# Patient Record
Sex: Male | Born: 1940 | Race: White | Hispanic: No | Marital: Married | Smoking: Never smoker
Health system: Southern US, Community
[De-identification: ages and names within clinical notes are randomized; demographics above are authoritative.]

## PROBLEM LIST (undated history)

## (undated) DIAGNOSIS — N189 Chronic kidney disease, unspecified: Secondary | ICD-10-CM

## (undated) DIAGNOSIS — I10 Essential (primary) hypertension: Secondary | ICD-10-CM

## (undated) DIAGNOSIS — C801 Malignant (primary) neoplasm, unspecified: Secondary | ICD-10-CM

## (undated) DIAGNOSIS — G4730 Sleep apnea, unspecified: Secondary | ICD-10-CM

## (undated) HISTORY — PX: JOINT REPLACEMENT: SHX530

## (undated) HISTORY — PX: NEPHRECTOMY: SHX65

## (undated) HISTORY — PX: COLON SURGERY: SHX602

---

## 2000-01-19 ENCOUNTER — Encounter: Payer: Self-pay | Admitting: Orthopedic Surgery

## 2000-01-26 ENCOUNTER — Inpatient Hospital Stay (HOSPITAL_COMMUNITY): Admission: RE | Admit: 2000-01-26 | Discharge: 2000-02-03 | Payer: Self-pay | Admitting: Orthopedic Surgery

## 2000-01-30 ENCOUNTER — Encounter: Payer: Self-pay | Admitting: Orthopedic Surgery

## 2000-01-31 ENCOUNTER — Encounter: Payer: Self-pay | Admitting: Internal Medicine

## 2000-02-03 ENCOUNTER — Encounter: Payer: Self-pay | Admitting: Physical Medicine & Rehabilitation

## 2000-02-03 ENCOUNTER — Inpatient Hospital Stay (HOSPITAL_COMMUNITY)
Admission: RE | Admit: 2000-02-03 | Discharge: 2000-02-09 | Payer: Self-pay | Admitting: Physical Medicine & Rehabilitation

## 2000-02-10 ENCOUNTER — Encounter
Admission: RE | Admit: 2000-02-10 | Discharge: 2000-05-03 | Payer: Self-pay | Admitting: Physical Medicine & Rehabilitation

## 2000-02-16 ENCOUNTER — Encounter: Payer: Self-pay | Admitting: Family Medicine

## 2000-02-16 ENCOUNTER — Encounter: Admission: RE | Admit: 2000-02-16 | Discharge: 2000-02-16 | Payer: Self-pay | Admitting: Family Medicine

## 2001-08-02 ENCOUNTER — Encounter: Payer: Self-pay | Admitting: Family Medicine

## 2001-08-02 ENCOUNTER — Encounter: Admission: RE | Admit: 2001-08-02 | Discharge: 2001-08-02 | Payer: Self-pay | Admitting: Family Medicine

## 2003-03-09 ENCOUNTER — Emergency Department (HOSPITAL_COMMUNITY): Admission: EM | Admit: 2003-03-09 | Discharge: 2003-03-09 | Payer: Self-pay | Admitting: Emergency Medicine

## 2003-03-09 ENCOUNTER — Encounter: Payer: Self-pay | Admitting: Emergency Medicine

## 2003-03-19 ENCOUNTER — Encounter: Payer: Self-pay | Admitting: Urology

## 2003-03-19 ENCOUNTER — Encounter: Admission: RE | Admit: 2003-03-19 | Discharge: 2003-03-19 | Payer: Self-pay | Admitting: Urology

## 2003-03-23 ENCOUNTER — Encounter: Payer: Self-pay | Admitting: Urology

## 2003-03-26 ENCOUNTER — Inpatient Hospital Stay (HOSPITAL_COMMUNITY): Admission: RE | Admit: 2003-03-26 | Discharge: 2003-03-30 | Payer: Self-pay | Admitting: Urology

## 2003-03-26 ENCOUNTER — Encounter (INDEPENDENT_AMBULATORY_CARE_PROVIDER_SITE_OTHER): Payer: Self-pay

## 2003-06-01 ENCOUNTER — Encounter: Admission: RE | Admit: 2003-06-01 | Discharge: 2003-06-01 | Payer: Self-pay | Admitting: Family Medicine

## 2003-06-01 ENCOUNTER — Encounter: Payer: Self-pay | Admitting: Family Medicine

## 2003-06-07 ENCOUNTER — Encounter: Admission: RE | Admit: 2003-06-07 | Discharge: 2003-06-07 | Payer: Self-pay | Admitting: Urology

## 2003-06-07 ENCOUNTER — Encounter: Payer: Self-pay | Admitting: Urology

## 2004-05-13 ENCOUNTER — Ambulatory Visit (HOSPITAL_COMMUNITY): Admission: RE | Admit: 2004-05-13 | Discharge: 2004-05-13 | Payer: Self-pay | Admitting: Urology

## 2004-10-10 ENCOUNTER — Ambulatory Visit (HOSPITAL_COMMUNITY): Admission: RE | Admit: 2004-10-10 | Discharge: 2004-10-10 | Payer: Self-pay | Admitting: Urology

## 2004-11-02 HISTORY — PX: OTHER SURGICAL HISTORY: SHX169

## 2005-12-08 ENCOUNTER — Ambulatory Visit (HOSPITAL_COMMUNITY): Admission: RE | Admit: 2005-12-08 | Discharge: 2005-12-08 | Payer: Self-pay | Admitting: General Surgery

## 2006-01-13 ENCOUNTER — Encounter: Admission: RE | Admit: 2006-01-13 | Discharge: 2006-01-13 | Payer: Self-pay | Admitting: Urology

## 2006-01-26 ENCOUNTER — Ambulatory Visit (HOSPITAL_COMMUNITY): Admission: RE | Admit: 2006-01-26 | Discharge: 2006-01-27 | Payer: Self-pay | Admitting: Interventional Radiology

## 2006-01-26 ENCOUNTER — Encounter (INDEPENDENT_AMBULATORY_CARE_PROVIDER_SITE_OTHER): Payer: Self-pay | Admitting: *Deleted

## 2006-02-23 ENCOUNTER — Encounter: Admission: RE | Admit: 2006-02-23 | Discharge: 2006-02-23 | Payer: Self-pay | Admitting: Interventional Radiology

## 2006-06-30 ENCOUNTER — Encounter: Admission: RE | Admit: 2006-06-30 | Discharge: 2006-06-30 | Payer: Self-pay | Admitting: Interventional Radiology

## 2007-02-17 ENCOUNTER — Encounter: Admission: RE | Admit: 2007-02-17 | Discharge: 2007-02-17 | Payer: Self-pay | Admitting: Interventional Radiology

## 2007-10-25 IMAGING — CT CT GUIDANCE TISSUE ABLATION
2 of 7 series · 12 of 32 positions shown, 17 images · non-contrast
Comparison: none

CLINICAL DATA: 54-year-old male with a prior history of right nephrectomy for renal cell carcinoma in March 2003.   The patient has now been diagnosed with a new and enlarging enhancing solid tumor of the left kidney emanating from the lateral upper pole cortex.   He has been referred for ablation of the tumor.  Prior consultation was performed on 01/13/06. 
[DATE].  CT GUIDED CORE BIOPSY OF THE LEFT KIDNEY:
2.  CT GUIDED RADIOFREQUENCY ABLATION OF LEFT RENAL TUMOR:
Prior to the procedure detailed informed consent had been obtained.  The patient received 1 gram IV Ancef.  
Anesthesia:  General.
After the patient was placed under general anesthesia he was put into a prone position on a CT table.  Once secured, unenhanced imaging was performed through the level of the entire left kidney.  After localizing a site for biopsy and ablation procedures along the left flank region, a large area was sterilely prepped and draped.  
Initial planning of angle for needle placement was performed by partially advancing a 22 gauge spinal needle.  Position was confirmed with CT images.  A 17 gauge Trochar needle was then advanced into the posterior margin of a left renal mass.  A single 18 gauge core sample was then performed utilizing an automated biopsy device.  The core sample was then placed in formalin for histologic analysis.  
Tandem placement of a 17 gauge Cool-Tip radiofrequency ablation probe was then performed.  A 10 cm length x 3 cm ablation zone probe was chosen for placement.  This was advanced under CT guidance into the left renal tumor.  After confirming ablation probe position a 12 minute cycle of radiofrequency ablation was performed with the [REDACTED] device.  Tissue temperatures were then measured as the probe was retracted.  The probe was then repositioned into a slightly higher position within the renal mass and additional 12 minute cycle of ablation performed.  Tissue temperatures were measured as the probe was retracted.  Upon completion of ablation the tract was cauterized to the level of the posterolateral oblique musculature.

[Series 2: rfa 5.0 b10f · axial · 0.98mm/px · z∈[-206,-56]mm · 7 of 40 slices shown, 12 images (1 of 2)]
[im 5/40  soft-tissue]
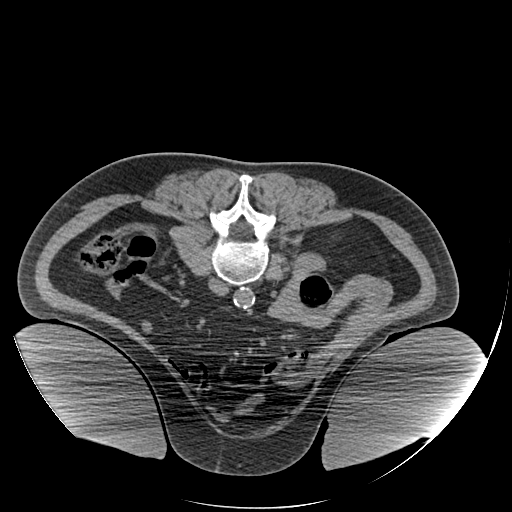
[im 5/40  bone]
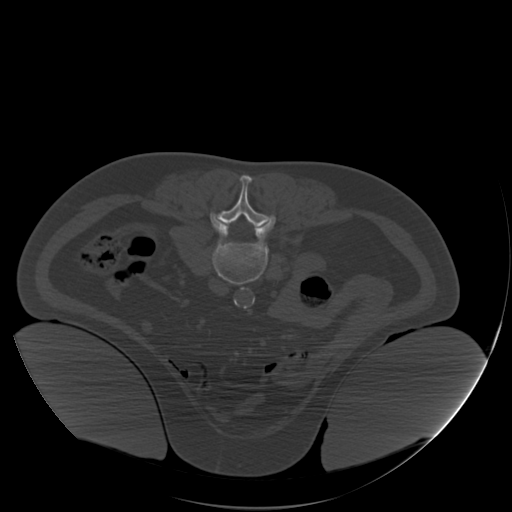
[im 10/40  soft-tissue]
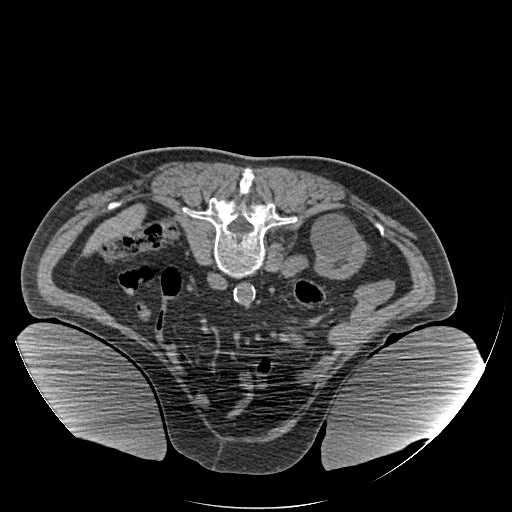
[im 15/40  soft-tissue]
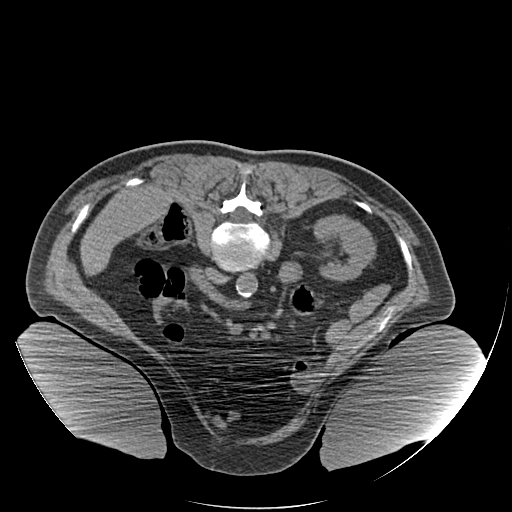
[im 20/40  soft-tissue]
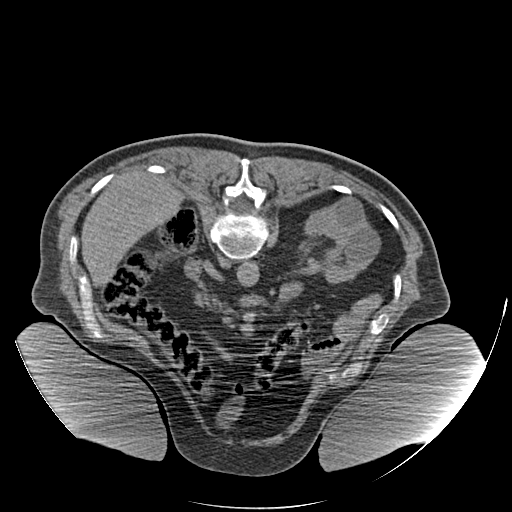
[im 20/40  lung]
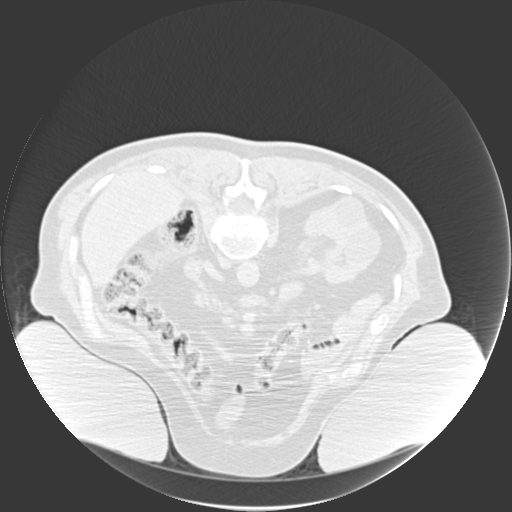
[im 25/40  soft-tissue]
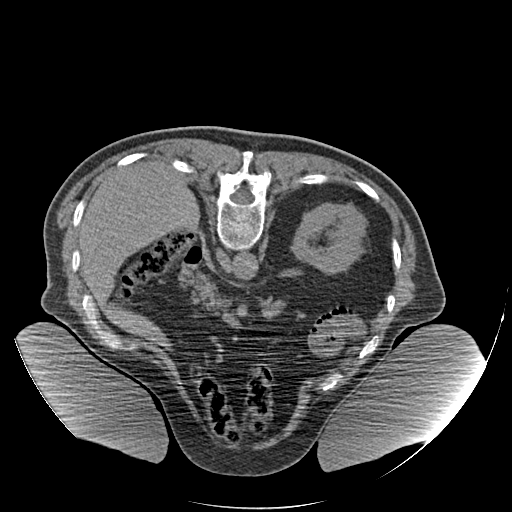
[im 25/40  lung]
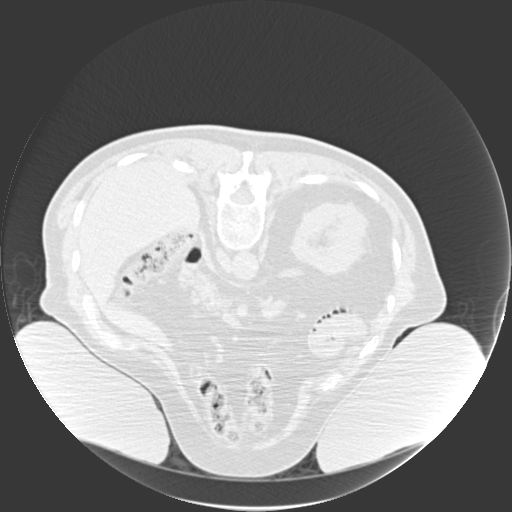
[im 30/40  soft-tissue]
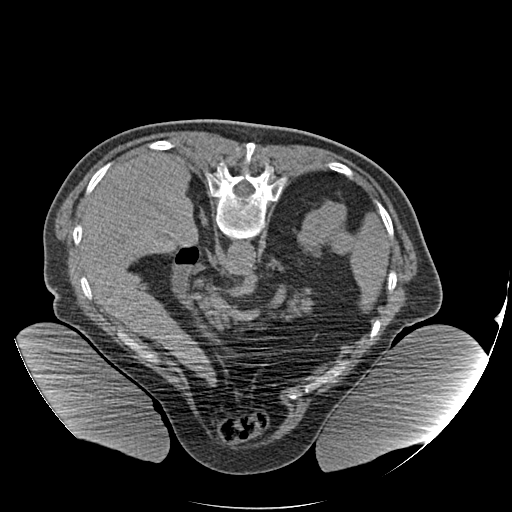
[im 30/40  lung]
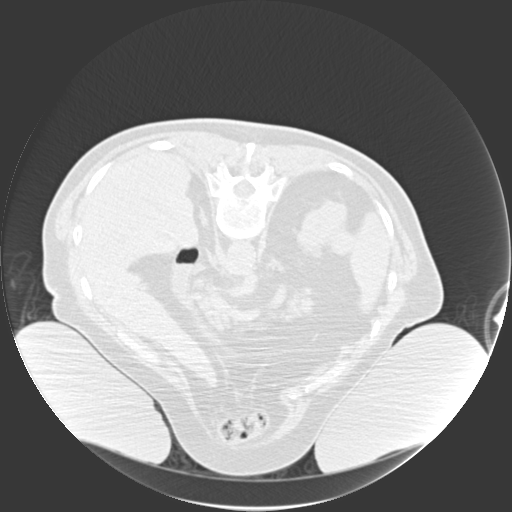
[im 35/40  soft-tissue]
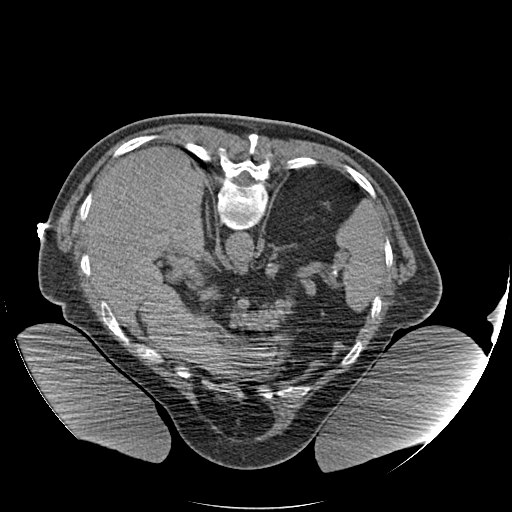
[im 35/40  lung]
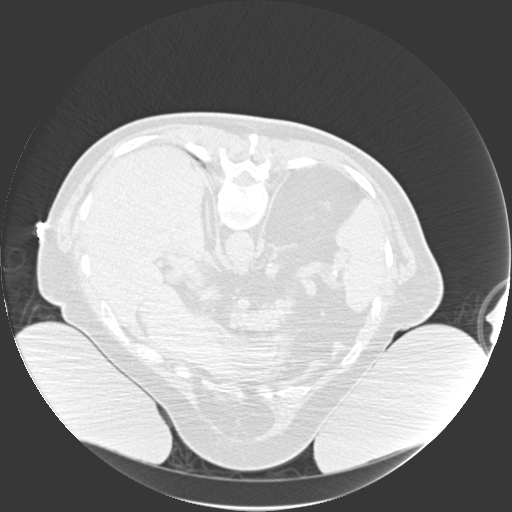

[Series 8: rfa 5.0 b10f · axial · 0.98mm/px · z∈[-166,-76]mm · 5 of 33 slices shown (2 of 2)]
[im 5/33  soft-tissue]
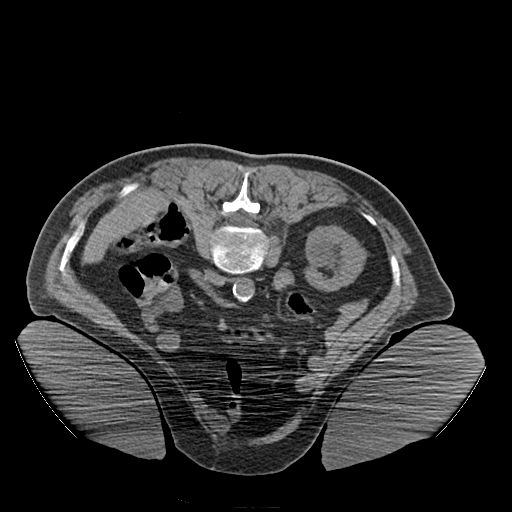
[im 10/33  soft-tissue]
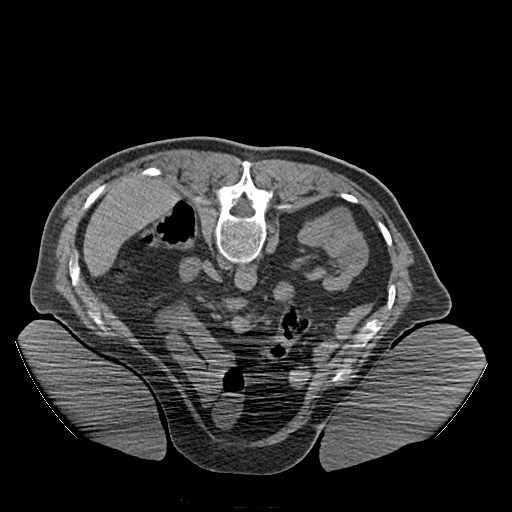
[im 14/33  soft-tissue]
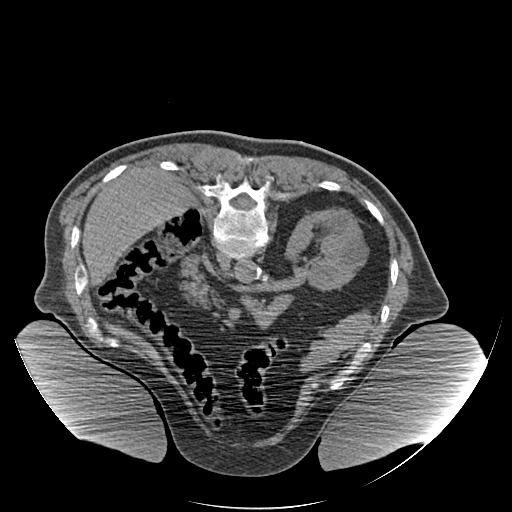
[im 19/33  soft-tissue]
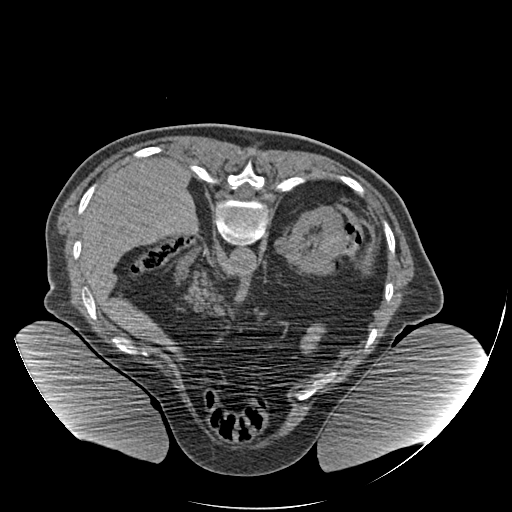
[im 23/33  soft-tissue]
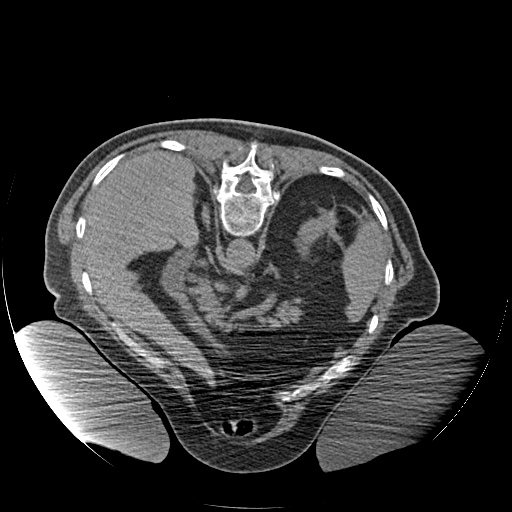

[12 of 32 positions shown; findings below may reference images not displayed]

FINDINGS: Initial CT again demonstrates the previously studied enhancing solid tumor emanating from the lateral cortex of the upper pole of the remaining left kidney.  This measures approximately 2.6 cm in greatest diameter.  Core biopsy was able to be performed directly through the central portion of the mass yielding 1 excellent tissue core sample.  Immediately after biopsy CT did show a small amount of adjacent perinephric hemorrhage.  
Subsequent tissue ablation was performed.  Initially probe placement was through the central portion of the mass with a single 12 minute cycle performed.  This resulted in adequate tissue temperatures within the tumor of greater than 80 degrees Centigrade.  
Due to somewhat slightly irregular shape of the lesion with a more lateral exophytic superior component it was decided to perform a 2nd cycle of ablation after adjusting the probe into the more superior aspect of the tumor.  After a 12 minute cycle this did result in adequate tissue temperatures of greater than 80 degrees centigrade within the tumor.  A short segment of the tract was then cauterized upon completion of the procedure.  Final CT images show a stable small amount of perinephric hemorrhage present immediately posterolateral to the upper to mid-kidney.  Upon completion of the procedure the patient was sent to PACU for initial recovery.  He also will be admitted for overnight observation.
IMPRESSION: Successful CT guided core biopsy and percutaneous radiofrequency ablation of a solid tumor of the left kidney.  A single 18 G core tissue sample was sent for histology.  Adequate tissue temperatures were achieved in the tumor after 2 cycles of radiofrequency ablation.  The patient was admitted for overnight observation for pain control and monitoring.  CBC and renal function will be checked in the morning and discharge is planned on 01/27/06.  week followup CT and outpatient visit will also be scheduled at the time of discharge.

## 2008-01-25 ENCOUNTER — Encounter: Admission: RE | Admit: 2008-01-25 | Discharge: 2008-01-25 | Payer: Self-pay | Admitting: Interventional Radiology

## 2009-02-26 ENCOUNTER — Encounter: Admission: RE | Admit: 2009-02-26 | Discharge: 2009-02-26 | Payer: Self-pay | Admitting: Urology

## 2009-03-01 ENCOUNTER — Encounter: Admission: RE | Admit: 2009-03-01 | Discharge: 2009-03-01 | Payer: Self-pay | Admitting: Family Medicine

## 2009-04-03 ENCOUNTER — Emergency Department: Payer: Self-pay | Admitting: Emergency Medicine

## 2009-08-07 ENCOUNTER — Ambulatory Visit: Payer: Self-pay | Admitting: Internal Medicine

## 2010-03-04 ENCOUNTER — Encounter: Admission: RE | Admit: 2010-03-04 | Discharge: 2010-03-04 | Payer: Self-pay | Admitting: Interventional Radiology

## 2010-03-04 ENCOUNTER — Ambulatory Visit (HOSPITAL_COMMUNITY): Admission: RE | Admit: 2010-03-04 | Discharge: 2010-03-04 | Payer: Self-pay | Admitting: Interventional Radiology

## 2010-11-23 ENCOUNTER — Encounter: Payer: Self-pay | Admitting: Interventional Radiology

## 2011-02-12 ENCOUNTER — Other Ambulatory Visit: Payer: Self-pay | Admitting: Interventional Radiology

## 2011-02-18 ENCOUNTER — Other Ambulatory Visit (HOSPITAL_COMMUNITY): Payer: Self-pay | Admitting: Interventional Radiology

## 2011-02-18 DIAGNOSIS — N2889 Other specified disorders of kidney and ureter: Secondary | ICD-10-CM

## 2011-03-20 NOTE — Op Note (Signed)
NAME:  Henry Lewis, Henry Lewis NO.:  [id number]   MEDICAL RECORD NO.:  [id number]                   PATIENT TYPE:  INP   LOCATION:  0009                                 FACILITY:  [facility]   PHYSICIAN:  Rozanna Boer., M.D.      DATE OF BIRTH:  07/23/1941   DATE OF PROCEDURE:  03/26/2003  DATE OF DISCHARGE:                                 OPERATIVE REPORT   PREOPERATIVE DIAGNOSIS:  Right renal cell carcinoma.   POSTOPERATIVE DIAGNOSIS:  Right renal cell carcinoma pending pathology  report.   OPERATION:  Right radical nephrectomy.   ANESTHESIA:  General.   SURGEON:  Courtney Paris, M.D.   ASSISTANT:  Lindaann Slough, M.D.   PROCEDURE:  The patient was placed on the operating table in the supine  position with the kidney rest elevated. After the satisfactory induction of  general anesthesia he was shaved, prepped and draped in the usual sterile  fashion and given IV Ancef. A Foley catheter was inserted.   A right subcostal incision was made and carried down through the  subcutaneous tissue to open the external oblique fascia. Next the internal  oblique and transversalis fascias were opened as they were encountered. The  abdominal cavity was then entered.   The colon was then reflected laterally by incising the line of Toldt up and  around the hepatic flexure of the colon. I freed up the duodenum off the  vena cava and then dissected down onto the vena cava. I was  able to find  the renal vein fairly anteriorly. A #1 silk suture was passed around this  but not tied. The artery was just below this. Further dissection revealed  the artery and again a proximal tie was then made on the artery and then the  vein was then divided between #1 silk sutures with 2 proximal ties, 1  distal. The renal vein was then cut.   Next  the artery was then identified  and 2 more sutures were placed, one  more proximal and one distal and this was also cut.  This seemed to be the  main of the blood supply except for the gonadal vessel which was tied  off  close to the cava with 0 silk suture and dissection was carried anteriorly  across the anterior fascia around the kidney. The adrenal vein was also  identified, ligated with silk and clips.   The uppermost part of the kidney was then freed up, and then pulling the  kidney down, I went through the hilum with no other branches of the vessels  being found. I found the ureter, clipped it, and then ligated another vein  next to it. The specimen was then removed intact with its Gerota's fascia  surroundings.   The wound was then irrigated with sterile water. There was no evidence of  any enlarged lymphadenopathy in the wound and hemostasis was quite good. The  colon was then  placed back in its anatomic location and the wound was closed  with running #1 PDS sutures for the transversalis layer and internal and  external oblique layers. The skin was  closed with clips. No drain was placed. All sponge, instrument and needle  counts were correct on 2 occasions. Estimated blood loss was less than 300  cc which the patient tolerated without difficulty. The patient was taken to  the recovery room in good condition to be later admitted to the for further  treatment and care.                                               Rozanna Boer., M.D.    HMK/MEDQ  D:  03/26/2003  T:  03/26/2003  Job:  [id number]
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
## 2011-03-25 ENCOUNTER — Ambulatory Visit
Admission: RE | Admit: 2011-03-25 | Discharge: 2011-03-25 | Disposition: A | Discharge status: Home / Self Care | Payer: Medicare Other | Source: Ambulatory Visit | Attending: Interventional Radiology | Admitting: Interventional Radiology

## 2011-03-25 ENCOUNTER — Encounter (HOSPITAL_COMMUNITY): Payer: Self-pay

## 2011-03-25 ENCOUNTER — Ambulatory Visit (HOSPITAL_COMMUNITY)
Admission: RE | Admit: 2011-03-25 | Discharge: 2011-03-25 | Disposition: A | Discharge status: Home / Self Care | Payer: Medicare Other | Source: Ambulatory Visit | Attending: Interventional Radiology | Admitting: Interventional Radiology

## 2011-03-25 DIAGNOSIS — C649 Malignant neoplasm of unspecified kidney, except renal pelvis: Secondary | ICD-10-CM | POA: Insufficient documentation

## 2011-03-25 DIAGNOSIS — N2889 Other specified disorders of kidney and ureter: Secondary | ICD-10-CM

## 2011-03-25 DIAGNOSIS — Z905 Acquired absence of kidney: Secondary | ICD-10-CM | POA: Insufficient documentation

## 2011-03-25 DIAGNOSIS — K439 Ventral hernia without obstruction or gangrene: Secondary | ICD-10-CM | POA: Insufficient documentation

## 2011-03-25 DIAGNOSIS — I700 Atherosclerosis of aorta: Secondary | ICD-10-CM | POA: Insufficient documentation

## 2011-03-25 DIAGNOSIS — Q618 Other cystic kidney diseases: Secondary | ICD-10-CM | POA: Insufficient documentation

## 2011-03-25 HISTORY — DX: Malignant (primary) neoplasm, unspecified: C80.1

## 2011-03-25 HISTORY — DX: Essential (primary) hypertension: I10

## 2011-03-25 MED ORDER — IOHEXOL 300 MG/ML  SOLN
75.0000 mL | Freq: Once | INTRAMUSCULAR | Status: AC | PRN
  Administered 2011-03-25: 75 mL via INTRAVENOUS

## 2012-10-19 ENCOUNTER — Emergency Department: Payer: Self-pay | Admitting: Emergency Medicine

## 2013-04-07 ENCOUNTER — Ambulatory Visit: Payer: Self-pay | Admitting: Internal Medicine

## 2015-09-11 ENCOUNTER — Encounter: Payer: Self-pay | Admitting: *Deleted

## 2015-09-12 ENCOUNTER — Encounter: Payer: Self-pay | Admitting: *Deleted

## 2015-09-12 ENCOUNTER — Ambulatory Visit
Admission: RE | Admit: 2015-09-12 | Discharge: 2015-09-12 | Disposition: A | Discharge status: Home / Self Care | Payer: Medicare Other | Source: Ambulatory Visit | Attending: Unknown Physician Specialty | Admitting: Unknown Physician Specialty

## 2015-09-12 ENCOUNTER — Ambulatory Visit: Payer: Medicare Other | Admitting: Anesthesiology

## 2015-09-12 ENCOUNTER — Encounter
Admission: RE | Disposition: A | Discharge status: Home / Self Care | Payer: Self-pay | Source: Ambulatory Visit | Attending: Unknown Physician Specialty

## 2015-09-12 DIAGNOSIS — Z1211 Encounter for screening for malignant neoplasm of colon: Secondary | ICD-10-CM | POA: Diagnosis not present

## 2015-09-12 DIAGNOSIS — Z905 Acquired absence of kidney: Secondary | ICD-10-CM | POA: Insufficient documentation

## 2015-09-12 DIAGNOSIS — Z7982 Long term (current) use of aspirin: Secondary | ICD-10-CM | POA: Diagnosis not present

## 2015-09-12 DIAGNOSIS — Z85528 Personal history of other malignant neoplasm of kidney: Secondary | ICD-10-CM | POA: Insufficient documentation

## 2015-09-12 DIAGNOSIS — I129 Hypertensive chronic kidney disease with stage 1 through stage 4 chronic kidney disease, or unspecified chronic kidney disease: Secondary | ICD-10-CM | POA: Insufficient documentation

## 2015-09-12 DIAGNOSIS — K640 First degree hemorrhoids: Secondary | ICD-10-CM | POA: Insufficient documentation

## 2015-09-12 DIAGNOSIS — N189 Chronic kidney disease, unspecified: Secondary | ICD-10-CM | POA: Diagnosis not present

## 2015-09-12 DIAGNOSIS — K5730 Diverticulosis of large intestine without perforation or abscess without bleeding: Secondary | ICD-10-CM | POA: Diagnosis not present

## 2015-09-12 DIAGNOSIS — G4730 Sleep apnea, unspecified: Secondary | ICD-10-CM | POA: Diagnosis not present

## 2015-09-12 DIAGNOSIS — Z79899 Other long term (current) drug therapy: Secondary | ICD-10-CM | POA: Insufficient documentation

## 2015-09-12 DIAGNOSIS — Z96653 Presence of artificial knee joint, bilateral: Secondary | ICD-10-CM | POA: Insufficient documentation

## 2015-09-12 HISTORY — DX: Chronic kidney disease, unspecified: N18.9

## 2015-09-12 HISTORY — PX: COLONOSCOPY WITH PROPOFOL: SHX5780

## 2015-09-12 HISTORY — DX: Sleep apnea, unspecified: G47.30

## 2015-09-12 SURGERY — COLONOSCOPY WITH PROPOFOL
Anesthesia: General

## 2015-09-12 MED ORDER — SODIUM CHLORIDE 0.9 % IV SOLN
INTRAVENOUS | Status: DC
  Administered 2015-09-12: 10:00:00 via INTRAVENOUS

## 2015-09-12 MED ORDER — PROPOFOL 500 MG/50ML IV EMUL
INTRAVENOUS | Status: DC | PRN
  Administered 2015-09-12: 120 ug/kg/min via INTRAVENOUS

## 2015-09-12 MED ORDER — PIPERACILLIN-TAZOBACTAM 3.375 G IVPB
3.3750 g | Freq: Once | INTRAVENOUS | Status: AC
  Administered 2015-09-12: 3.375 g via INTRAVENOUS
  Filled 2015-09-12: qty 50

## 2015-09-12 MED ORDER — MIDAZOLAM HCL 2 MG/2ML IJ SOLN
INTRAMUSCULAR | Status: DC | PRN
  Administered 2015-09-12: 1 mg via INTRAVENOUS

## 2015-09-12 MED ORDER — SODIUM CHLORIDE 0.9 % IV SOLN
INTRAVENOUS | Status: DC
Start: 2015-09-12 — End: 2015-09-12

## 2015-09-12 MED ORDER — FENTANYL CITRATE (PF) 100 MCG/2ML IJ SOLN
INTRAMUSCULAR | Status: DC | PRN
  Administered 2015-09-12: 50 ug via INTRAVENOUS
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
## 2015-09-12 NOTE — Op Note (Signed)
[facility] Patient Name: Henry Lewis Procedure Date: 09/12/2015 10:15 AM MRN: [id number] Account #: [id number] Date of Birth: June 27, 1941 Admit Type: Outpatient Age: 74 Room: [facility] ENDO ROOM 1 Gender: Male Note Status: Finalized Procedure:         Colonoscopy Indications:       Screening for colorectal malignant neoplasm Providers:         Manya Silvas, MD Referring MD:      Hewitt Blade. Sarina Ser, MD (Referring MD) Medicines:         Propofol per Anesthesia Complications:     No immediate complications. Procedure:         Pre-Anesthesia Assessment:                    - After reviewing the risks and benefits, the patient was                     deemed in satisfactory condition to undergo the procedure.                    After obtaining informed consent, the colonoscope was                     passed under direct vision. Throughout the procedure, the                     patient's blood pressure, pulse, and oxygen saturations                     were monitored continuously. The Colonoscope was                     introduced through the anus and advanced to the the cecum,                     identified by appendiceal orifice and ileocecal valve. The                     colonoscopy was performed without difficulty. The patient                     tolerated the procedure well. The quality of the bowel                     preparation was excellent. Findings:      A single small-mouthed diverticulum was found in the sigmoid colon.      Internal hemorrhoids were found during endoscopy. The hemorrhoids were       small and Grade I (internal hemorrhoids that do not prolapse).      The exam was otherwise without abnormality. Impression:        - Diverticulosis in the sigmoid colon.                    - Internal hemorrhoids.                    - No specimens collected. Recommendation:    - The findings and recommendations were discussed with the                 patient. Manya Silvas, MD 09/12/2015 10:43:16 AM This report has been signed electronically. Number of Addenda: 0 Note Initiated On: 09/12/2015 10:15 AM Scope Withdrawal Time: 0 hours 7 minutes 36 seconds  Total Procedure  Duration: 0 hours 11 minutes 18 seconds       [facility]

## 2015-09-12 NOTE — Anesthesia Preprocedure Evaluation (Addendum)
Anesthesia Evaluation  Patient identified by MRN, date of birth, ID band Patient awake    Reviewed: Allergy & Precautions, NPO status , Patient's Chart, lab work & pertinent test results  Airway Mallampati: II  TM Distance: >3 FB     Dental  (+) Upper Dentures, Lower Dentures   Pulmonary sleep apnea ,    Pulmonary exam normal        Cardiovascular hypertension, Pt. on medications Normal cardiovascular exam     Neuro/Psych negative neurological ROS  negative psych ROS   GI/Hepatic negative GI ROS, Neg liver ROS,   Endo/Other  negative endocrine ROS  Renal/GU Renal diseaseRenal cell CA  negative genitourinary   Musculoskeletal negative musculoskeletal ROS (+)   Abdominal Normal abdominal exam  (+)   Peds negative pediatric ROS (+)  Hematology negative hematology ROS (+)   Anesthesia Other Findings   Reproductive/Obstetrics                            Anesthesia Physical Anesthesia Plan  ASA: III  Anesthesia Plan: General   Post-op Pain Management:    Induction: Intravenous  Airway Management Planned: Nasal Cannula  Additional Equipment:   Intra-op Plan:   Post-operative Plan:   Informed Consent: I have reviewed the patients History and Physical, chart, labs and discussed the procedure including the risks, benefits and alternatives for the proposed anesthesia with the patient or authorized representative who has indicated his/her understanding and acceptance.   Dental advisory given  Plan Discussed with: CRNA and Surgeon  Anesthesia Plan Comments:         Anesthesia Quick Evaluation

## 2015-09-12 NOTE — Transfer of Care (Signed)
Immediate Anesthesia Transfer of Care Note  Patient: Henry Lewis  Procedure(s) Performed: Procedure(s): COLONOSCOPY WITH PROPOFOL (N/A)  Patient Location: PACU  Anesthesia Type:General  Level of Consciousness: awake and unresponsive  Airway & Oxygen Therapy: Patient Spontanous Breathing and Patient connected to nasal cannula oxygen  Post-op Assessment: Report given to RN and Post -op Vital signs reviewed and stable  Post vital signs: Reviewed and stable  Last Vitals:  Filed Vitals:   09/12/15 1045  BP:   Pulse: 62  Temp: 36.4 C  Resp: 20    Complications: none

## 2015-09-12 NOTE — Anesthesia Procedure Notes (Signed)
Performed by: COOK-MARTIN, Shirley Bolle Pre-anesthesia Checklist: Patient identified, Emergency Drugs available, Suction available, Patient being monitored and Timeout performed Patient Re-evaluated:Patient Re-evaluated prior to inductionOxygen Delivery Method: Nasal cannula Preoxygenation: Pre-oxygenation with 100% oxygen Intubation Type: IV induction Placement Confirmation: positive ETCO2 and CO2 detector       

## 2015-09-12 NOTE — Anesthesia Postprocedure Evaluation (Signed)
  Anesthesia Post-op Note  Patient: Henry Lewis  Procedure(s) Performed: Procedure(s): COLONOSCOPY WITH PROPOFOL (N/A)  Anesthesia type:General  Patient location: PACU  Post pain: Pain level controlled  Post assessment: Post-op Vital signs reviewed, Patient's Cardiovascular Status Stable, Respiratory Function Stable, Patent Airway and No signs of Nausea or vomiting  Post vital signs: Reviewed and stable  Last Vitals:  Filed Vitals:   09/12/15 1110  BP: 123/77  Pulse: 59  Temp:   Resp: 18    Level of consciousness: awake, alert  and patient cooperative  Complications: No apparent anesthesia complications

## 2015-09-16 ENCOUNTER — Encounter: Payer: Self-pay | Admitting: Unknown Physician Specialty

## 2016-05-29 ENCOUNTER — Inpatient Hospital Stay
Admission: EM | Admit: 2016-05-29 | Discharge: 2016-06-01 | DRG: 186 | Disposition: A | Discharge status: Home / Self Care | Payer: Medicare Other | Attending: Specialist | Admitting: Specialist

## 2016-05-29 ENCOUNTER — Encounter: Payer: Self-pay | Admitting: Emergency Medicine

## 2016-05-29 ENCOUNTER — Emergency Department: Payer: Medicare Other

## 2016-05-29 DIAGNOSIS — N183 Chronic kidney disease, stage 3 (moderate): Secondary | ICD-10-CM | POA: Diagnosis present

## 2016-05-29 DIAGNOSIS — I129 Hypertensive chronic kidney disease with stage 1 through stage 4 chronic kidney disease, or unspecified chronic kidney disease: Secondary | ICD-10-CM | POA: Diagnosis present

## 2016-05-29 DIAGNOSIS — M109 Gout, unspecified: Secondary | ICD-10-CM | POA: Diagnosis present

## 2016-05-29 DIAGNOSIS — Z96653 Presence of artificial knee joint, bilateral: Secondary | ICD-10-CM | POA: Diagnosis present

## 2016-05-29 DIAGNOSIS — J9600 Acute respiratory failure, unspecified whether with hypoxia or hypercapnia: Secondary | ICD-10-CM

## 2016-05-29 DIAGNOSIS — Z7982 Long term (current) use of aspirin: Secondary | ICD-10-CM

## 2016-05-29 DIAGNOSIS — J9601 Acute respiratory failure with hypoxia: Secondary | ICD-10-CM | POA: Diagnosis present

## 2016-05-29 DIAGNOSIS — Z96661 Presence of right artificial ankle joint: Secondary | ICD-10-CM | POA: Diagnosis present

## 2016-05-29 DIAGNOSIS — Z905 Acquired absence of kidney: Secondary | ICD-10-CM | POA: Diagnosis not present

## 2016-05-29 DIAGNOSIS — Z85528 Personal history of other malignant neoplasm of kidney: Secondary | ICD-10-CM

## 2016-05-29 DIAGNOSIS — J90 Pleural effusion, not elsewhere classified: Principal | ICD-10-CM | POA: Diagnosis present

## 2016-05-29 DIAGNOSIS — Z9889 Other specified postprocedural states: Secondary | ICD-10-CM

## 2016-05-29 DIAGNOSIS — G4733 Obstructive sleep apnea (adult) (pediatric): Secondary | ICD-10-CM | POA: Diagnosis present

## 2016-05-29 DIAGNOSIS — Z79899 Other long term (current) drug therapy: Secondary | ICD-10-CM | POA: Diagnosis not present

## 2016-05-29 DIAGNOSIS — R0902 Hypoxemia: Secondary | ICD-10-CM

## 2016-05-29 LAB — COMPREHENSIVE METABOLIC PANEL
ALK PHOS: 145 U/L — AB (ref 38–126)
ALT: 55 U/L (ref 17–63)
AST: 48 U/L — AB (ref 15–41)
Albumin: 2.8 g/dL — ABNORMAL LOW (ref 3.5–5.0)
Anion gap: 9 (ref 5–15)
BILIRUBIN TOTAL: 0.6 mg/dL (ref 0.3–1.2)
BUN: 19 mg/dL (ref 6–20)
CALCIUM: 9 mg/dL (ref 8.9–10.3)
CHLORIDE: 106 mmol/L (ref 101–111)
CO2: 26 mmol/L (ref 22–32)
CREATININE: 0.99 mg/dL (ref 0.61–1.24)
Glucose, Bld: 131 mg/dL — ABNORMAL HIGH (ref 65–99)
Potassium: 3.8 mmol/L (ref 3.5–5.1)
Sodium: 141 mmol/L (ref 135–145)
TOTAL PROTEIN: 7 g/dL (ref 6.5–8.1)

## 2016-05-29 LAB — CBC
HEMATOCRIT: 38.6 % — AB (ref 40.0–52.0)
Hemoglobin: 13.1 g/dL (ref 13.0–18.0)
MCH: 30.5 pg (ref 26.0–34.0)
MCHC: 33.9 g/dL (ref 32.0–36.0)
MCV: 90 fL (ref 80.0–100.0)
PLATELETS: 352 10*3/uL (ref 150–440)
RBC: 4.29 MIL/uL — AB (ref 4.40–5.90)
RDW: 14 % (ref 11.5–14.5)
WBC: 14.4 10*3/uL — AB (ref 3.8–10.6)

## 2016-05-29 LAB — LACTIC ACID, PLASMA
LACTIC ACID, VENOUS: 1.5 mmol/L (ref 0.5–1.9)
Lactic Acid, Venous: 1.3 mmol/L (ref 0.5–1.9)

## 2016-05-29 LAB — TROPONIN I

## 2016-05-29 MED ORDER — ACETAMINOPHEN 325 MG PO TABS
650.0000 mg | ORAL_TABLET | Freq: Four times a day (QID) | ORAL | Status: DC | PRN
  Administered 2016-05-29 – 2016-05-30 (×3): 650 mg via ORAL
  Filled 2016-05-29 (×3): qty 2

## 2016-05-29 MED ORDER — HYDROCHLOROTHIAZIDE 25 MG PO TABS
25.0000 mg | ORAL_TABLET | Freq: Every day | ORAL | Status: DC
  Administered 2016-05-29 – 2016-06-01 (×4): 25 mg via ORAL
  Filled 2016-05-29 (×4): qty 1

## 2016-05-29 MED ORDER — ASPIRIN EC 81 MG PO TBEC
81.0000 mg | DELAYED_RELEASE_TABLET | Freq: Every day | ORAL | Status: DC
  Administered 2016-05-30 – 2016-06-01 (×3): 81 mg via ORAL
  Filled 2016-05-29 (×3): qty 1

## 2016-05-29 MED ORDER — ALLOPURINOL 100 MG PO TABS
300.0000 mg | ORAL_TABLET | Freq: Every day | ORAL | Status: DC
  Administered 2016-05-29 – 2016-06-01 (×4): 300 mg via ORAL
  Filled 2016-05-29 (×5): qty 3

## 2016-05-29 MED ORDER — SODIUM CHLORIDE 0.9% FLUSH
3.0000 mL | Freq: Two times a day (BID) | INTRAVENOUS | Status: DC
  Administered 2016-05-29 – 2016-06-01 (×6): 3 mL via INTRAVENOUS

## 2016-05-29 MED ORDER — DEXTROSE 5 % IV SOLN
1.0000 g | INTRAVENOUS | Status: DC
  Administered 2016-05-29 – 2016-05-31 (×3): 1 g via INTRAVENOUS
  Filled 2016-05-29 (×4): qty 10

## 2016-05-29 MED ORDER — ONDANSETRON HCL 4 MG/2ML IJ SOLN: 4.0000 mg | Freq: Four times a day (QID) | INTRAMUSCULAR | Status: DC | PRN

## 2016-05-29 MED ORDER — ACETAMINOPHEN 650 MG RE SUPP: 650.0000 mg | Freq: Four times a day (QID) | RECTAL | Status: DC | PRN

## 2016-05-29 MED ORDER — LISINOPRIL-HYDROCHLOROTHIAZIDE 20-25 MG PO TABS: 1.0000 | ORAL_TABLET | Freq: Every day | ORAL | Status: DC

## 2016-05-29 MED ORDER — ONDANSETRON HCL 4 MG PO TABS: 4.0000 mg | ORAL_TABLET | Freq: Four times a day (QID) | ORAL | Status: DC | PRN

## 2016-05-29 MED ORDER — ACETAMINOPHEN 500 MG PO TABS
1000.0000 mg | ORAL_TABLET | ORAL | Status: AC
  Administered 2016-05-29: 1000 mg via ORAL
  Filled 2016-05-29: qty 2

## 2016-05-29 MED ORDER — VANCOMYCIN HCL IN DEXTROSE 1-5 GM/200ML-% IV SOLN
1000.0000 mg | Freq: Once | INTRAVENOUS | Status: AC
  Administered 2016-05-29: 1000 mg via INTRAVENOUS
  Filled 2016-05-29: qty 200

## 2016-05-29 MED ORDER — LISINOPRIL 20 MG PO TABS
20.0000 mg | ORAL_TABLET | Freq: Every day | ORAL | Status: DC
  Administered 2016-05-29 – 2016-06-01 (×4): 20 mg via ORAL
  Filled 2016-05-29 (×4): qty 1

## 2016-05-29 MED ORDER — AZITHROMYCIN 500 MG IV SOLR
250.0000 mg | INTRAVENOUS | Status: DC
  Administered 2016-05-29 – 2016-05-31 (×3): 250 mg via INTRAVENOUS
  Filled 2016-05-29 (×4): qty 250

## 2016-05-29 NOTE — ED Notes (Addendum)
MD at bedside and patient informed of needing admission and results of all imagine workups.
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
## 2016-05-29 NOTE — ED Triage Notes (Signed)
Pt was diagnosed with pneumonia a couple weeks ago, pt completed first antibiotic without relief, pt is c/o shortness of breath, sats dropping into the upper 80's weakness and unable to speak as usual due shortness of breath, pt is currently taking his 2nd antibiotic

## 2016-05-30 ENCOUNTER — Inpatient Hospital Stay: Payer: Medicare Other

## 2016-05-30 ENCOUNTER — Inpatient Hospital Stay
Admit: 2016-05-30 | Discharge: 2016-05-30 | Disposition: A | Discharge status: Home / Self Care | Payer: Medicare Other | Attending: Critical Care Medicine | Admitting: Critical Care Medicine

## 2016-05-30 DIAGNOSIS — J9601 Acute respiratory failure with hypoxia: Secondary | ICD-10-CM

## 2016-05-30 LAB — GRAM STAIN

## 2016-05-30 LAB — BASIC METABOLIC PANEL
ANION GAP: 6 (ref 5–15)
BUN: 19 mg/dL (ref 6–20)
CHLORIDE: 105 mmol/L (ref 101–111)
CO2: 28 mmol/L (ref 22–32)
Calcium: 8.4 mg/dL — ABNORMAL LOW (ref 8.9–10.3)
Creatinine, Ser: 0.95 mg/dL (ref 0.61–1.24)
GFR calc non Af Amer: >60 mL/min (ref >60)
Glucose, Bld: 121 mg/dL — ABNORMAL HIGH (ref 65–99)
Potassium: 4.1 mmol/L (ref 3.5–5.1)
Sodium: 139 mmol/L (ref 135–145)

## 2016-05-30 LAB — BODY FLUID CELL COUNT WITH DIFFERENTIAL
Eos, Fluid: 5 %
Lymphs, Fluid: 84 %
Monocyte-Macrophage-Serous Fluid: 6 %
Neutrophil Count, Fluid: 5 %
OTHER CELLS FL: 0 %
Total Nucleated Cell Count, Fluid: 10433 cu mm

## 2016-05-30 LAB — AMYLASE, BODY FLUID: Amylase, Fluid: 30 U/L

## 2016-05-30 LAB — LACTATE DEHYDROGENASE, PLEURAL OR PERITONEAL FLUID: LD FL: 390 U/L — AB (ref 3–23)

## 2016-05-30 LAB — CBC
HEMATOCRIT: 37.7 % — AB (ref 40.0–52.0)
HEMOGLOBIN: 12.7 g/dL — AB (ref 13.0–18.0)
MCH: 30.4 pg (ref 26.0–34.0)
MCHC: 33.6 g/dL (ref 32.0–36.0)
MCV: 90.5 fL (ref 80.0–100.0)
Platelets: 328 10*3/uL (ref 150–440)
RBC: 4.16 MIL/uL — AB (ref 4.40–5.90)
RDW: 14 % (ref 11.5–14.5)
WBC: 14.4 10*3/uL — ABNORMAL HIGH (ref 3.8–10.6)

## 2016-05-30 LAB — PROTEIN, BODY FLUID: TOTAL PROTEIN, FLUID: 3.9 g/dL

## 2016-05-30 LAB — ECHOCARDIOGRAM COMPLETE
Height: 72 in
Weight: 3920 oz

## 2016-05-30 LAB — GLUCOSE, SEROUS FLUID: GLUCOSE FL: 116 mg/dL

## 2016-05-30 MED ORDER — IPRATROPIUM-ALBUTEROL 0.5-2.5 (3) MG/3ML IN SOLN: 3.0000 mL | Freq: Four times a day (QID) | RESPIRATORY_TRACT | Status: DC

## 2016-05-30 MED ORDER — IPRATROPIUM-ALBUTEROL 0.5-2.5 (3) MG/3ML IN SOLN
3.0000 mL | Freq: Four times a day (QID) | RESPIRATORY_TRACT | Status: DC | PRN
  Administered 2016-05-30: 3 mL via RESPIRATORY_TRACT
  Filled 2016-05-30: qty 3

## 2016-05-30 NOTE — Progress Notes (Addendum)
[place] [facility] [place] NAME: Henry Lewis    MR#:  [id number]  DATE OF BIRTH:  May 20, 1941  SUBJECTIVE:   Still having some significant shortness of breath and right-sided pleuritic chest pain. Awaiting ultrasound-guided thoracentesis today. Wife at bedside.  REVIEW OF SYSTEMS:    Review of Systems  Constitutional: Negative for chills and fever.  HENT: Negative for congestion and tinnitus.   Eyes: Negative for blurred vision and double vision.  Respiratory: Positive for shortness of breath. Negative for cough and wheezing.   Cardiovascular: Positive for chest pain (Pleuritic). Negative for orthopnea and PND.  Gastrointestinal: Negative for abdominal pain, diarrhea, nausea and vomiting.  Genitourinary: Negative for dysuria and hematuria.  Neurological: Negative for dizziness, sensory change and focal weakness.  All other systems reviewed and are negative.   Nutrition: Heart Health/Carb control Tolerating Diet: Yes Tolerating PT: Await eval.    DRUG ALLERGIES:   Allergies  Allergen Reactions  . Codeine Sulfate   . Morphine And Related Other (See Comments)    Higher doses - hallucinates  . Latex Palpitations    Skin irritation with latex bandaids    VITALS:  Blood pressure 132/75, pulse 84, temperature 98 F (36.7 C), temperature source Axillary, resp. rate (!) 92, height 6' (1.829 m), weight 111.1 kg (245 lb), SpO2 91 %.  PHYSICAL EXAMINATION:   Physical Exam  GENERAL:  75 y.o.-year-old patient lying in the bed in mild resp. Distress. EYES: Pupils equal, round, reactive to light and accommodation. No scleral icterus. Extraocular muscles intact.  HEENT: Head atraumatic, normocephalic. Oropharynx and nasopharynx clear.  NECK:  Supple, no jugular venous distention. No thyroid enlargement, no tenderness.  LUNGS: Poor air entry on the right mid to lower lung fields. Dullness to percussion. Positive use of accessory muscles. No  wheezing, rhonchi.  CARDIOVASCULAR: S1, S2 normal. No murmurs, rubs, or gallops.  ABDOMEN: Soft, nontender, nondistended. Bowel sounds present. No organomegaly or mass.  EXTREMITIES: No cyanosis, clubbing or edema b/l.    NEUROLOGIC: Cranial nerves II through XII are intact. No focal Motor or sensory deficits b/l.   PSYCHIATRIC: The patient is alert and oriented x 3.  SKIN: No obvious rash, lesion, or ulcer.    LABORATORY PANEL:   CBC  Recent Labs Lab 05/30/16 0421  WBC 14.4*  HGB 12.7*  HCT 37.7*  PLT 328   ------------------------------------------------------------------------------------------------------------------  Chemistries   Recent Labs Lab 05/29/16 1205 05/30/16 0421  NA 141 139  K 3.8 4.1  CL 106 105  CO2 26 28  GLUCOSE 131* 121*  BUN 19 19  CREATININE 0.99 0.95  CALCIUM 9.0 8.4*  AST 48*  --   ALT 55  --   ALKPHOS 145*  --   BILITOT 0.6  --    ------------------------------------------------------------------------------------------------------------------  Cardiac Enzymes  Recent Labs Lab 05/29/16 1205  TROPONINI <0.03   ------------------------------------------------------------------------------------------------------------------  RADIOLOGY:  Dg Chest 2 View  Result Date: 05/29/2016 CLINICAL DATA:  Pneumonia and shortness of breath. EXAM: CHEST  2 VIEW COMPARISON:  04/03/2009 FINDINGS: Cardiomediastinal silhouette is normal. Mediastinal contours appear intact. There is no evidence of pneumothorax. There is minimal left lung base atelectasis. There is a large right pleural effusion with right lower lobe atelectasis versus airspace consolidation. Osseous structures are without acute abnormality. Soft tissues are grossly normal. IMPRESSION: Large right pleural effusion with right lower lobe airspace consolidation versus atelectasis. Minimal atelectatic changes of the left lung base. Electronically Signed   By: Godsil Hoff  Dimitrova M.D.   On:  05/29/2016 13:22  Ct Chest Wo Contrast  Result Date: 05/29/2016 CLINICAL DATA:  75 year old male with shortness of breath and hypoxia. Patient on second round of antibiotics for recently diagnosed pneumonia. EXAM: CT CHEST WITHOUT CONTRAST TECHNIQUE: Multidetector CT imaging of the chest was performed following the standard protocol without IV contrast. COMPARISON:  05/29/2016 and prior radiographs. 04/07/2013 abdominal and pelvic CT FINDINGS: Cardiovascular: Heart size is within normal limits. Coronary artery calcifications are identified. Thoracic aortic atherosclerotic calcifications are present without evidence of aneurysm. Mediastinum/Nodes: Upper limits of normal sized mediastinal lymph nodes are identified. There is mild mediastinal shift to the left. Lungs/Pleura: A large right pleural effusion is noted with moderate to severe right lung atelectasis. No definite pleural masses are identified on this noncontrast study. Mild left basilar atelectasis is present. There is no evidence of pneumothorax or discrete mass. Upper Abdomen: No acute abnormality. Left renal cysts and angiomyolipoma again noted. Abdominal aortic atherosclerotic calcifications noted. Musculoskeletal: No acute or suspicious abnormalities identified. IMPRESSION: Large right pleural effusion with moderate to severe right lung atelectasis. No definite pleural abnormality identified on this noncontrast study. Coronary artery disease and thoracic aortic atherosclerosis. Abdominal aortic atherosclerosis. Electronically Signed   By: Margarette Canada M.D.   On: 05/29/2016 16:01    ASSESSMENT AND PLAN:   75 year old male with past medical history of hypertension, chronic kidney disease stage III, history of renal cell carcinoma status post nephrectomy, who presents to the hospital due to shortness of breath and noted to be in acute respiratory failure with hypoxia.  1. Acute respiratory failure with hypoxia-this is secondary to the pleural  effusion as seen on CT chest. -Continue supportive care with O2 support for now.  - pt. To get US guided thoracentesis today.  Wean O2 as tolerated post-procedure.  2. Pleural effusion-this is the cause of patient's respiratory failure with hypoxia. Etiology unclear. Questionable parapneumonic in nature. Although patient is afebrile and did not have any acute symptoms consistent with pneumonia prior to admission. ?? Malignant given hx of Renal Cell CA. -Empirically  Cont. IV ceftriaxone and Zithromax. - await US guided thoracentesis.  Will get Pulmonary consult.    3. Leukocytosis-secondary to the pleural effusion/suspected pneumonia. -Continue IV antibiotics as mentioned above and follow white cell count.  4. Essential hypertension-continue Lisinopril/HCTZ.  5. Hx of Gout - no acute attack - cont. Allopurinol.     All the records are reviewed and case discussed with Care Management/Social Workerr. Management plans discussed with the patient, family and they are in agreement.  CODE STATUS: Full Code  DVT Prophylaxis: TED's SCD's  TOTAL TIME TAKING CARE OF THIS PATIENT: 30 minutes.   POSSIBLE D/C IN 2-3 DAYS, DEPENDING ON CLINICAL CONDITION.   Henreitta Leber M.D on 05/30/2016 at 11:57 AM  Between 7am to 6pm - Pager - [phone]  After 6pm go to [email or web address] - password [email or web address] [place] [facility]  Office  [phone]  CC: Primary care physician; Madelyn Brunner, MD

## 2016-05-30 NOTE — Consult Note (Signed)
Name: Henry Lewis MRN: [id number] DOB: 03-Jun-1941     Patient seen and examined with NP, agree with assessment and plan.  Acute respiratory failure due to large right-sided pleural effusion. Review of the CT chest images from7/28/17 shows a large non-loculated, free-flowing right-sided pleural effusion On physical exam, the patient has decreased air entry in the right lung with dullness to percussion at right base. -Would recommend diagnostic and therapeutic right-sided thoracentesis, we'll await results.  -Marda Stalker M.D. 05/30/2016   ADMISSION DATE:  05/29/2016 CONSULTATION DATE:  05/30/2016  REFERRING MD :  Dr. Verdell Carmine  CHIEF COMPLAINT:  Shortness of Breath  BRIEF PATIENT DESCRIPTION:  This is a 75 yo male who presents to [facility] ER on 7/28 with c/o shortness of breath worsening over the past 2 weeks. PCCM consulted 7/29 due to acute hypoxic respiratory failure secondary to moderate to large right sided pleural effusion and ?CAP  SIGNIFICANT EVENTS  7/28-Pt admitted to [facility] due to acute hypoxic respiratory failure secondary to moderate to large right sided pleural effusion and ?pneumonia   STUDIES:  Echo>> CT of chest 7/28>>Large right pleural effusion with moderate to severe right lung atelectasis. No definite pleural abnormality identified on this noncontrast study. Coronary artery disease and thoracic aortic atherosclerosis. 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  He saw his primary care physician who treated him with a 7-10 day course of oral Levaquin for suspected pneumonia.  Despite antibiotic treatment he did not clinically improve, he then was started on Zithromax 2 days prior to presentation to ER, however his symptoms did not improve. He  developed a fever of 102.2, mild nausea denies vomiting. This admission he underwent a CT scan of his chest which showed moderate to large right sided pleural effusion.  Pt denies exposure to asbestos, exotic birds, wood burning stove, heavy machinery, recent travel, sick contacts or second hand smoke.  He denies having a tobacco abuse history or unintentional weight loss, however he has had a decreased appetite over the past 2 weeks.  PCCM consulted 7/29 due to acute hypoxic respiratory failure secondary to moderate to large right sided pleural effusion and ?CAP.  PAST MEDICAL HISTORY :   has a past medical history of Chronic kidney disease; Hypertension; renal ca (( LT ) 12/2005); rt renal ca (dx'd 2006); and Sleep apnea.  has a past surgical history that includes Nephrectomy (Right); Joint replacement; Colon surgery; Colonoscopy with propofol (N/A, 09/12/2015); and kedney removed due to cancer (Right, 2006). Prior to Admission medications   Medication Sig Start Date End Date Taking? Authorizing Provider  allopurinol (ZYLOPRIM) 300 MG tablet Take 300 mg by mouth daily.   Yes Historical Provider, MD  azithromycin (ZITHROMAX) 500 MG tablet Take 500 mg by mouth daily. For 5 days 05/28/16 06/01/16 Yes Historical Provider, MD  lisinopril-hydrochlorothiazide (PRINZIDE,ZESTORETIC) 20-25 MG tablet Take 1 tablet by mouth daily.   Yes Historical Provider, MD  aspirin 81 MG tablet Take 81 mg by mouth daily.    Historical Provider, MD  Boswellia-Glucosamine-Vit D (GLUCOSAMINE COMPLEX PO) Take by mouth.    Historical Provider, MD  Cholecalciferol (VITAMIN D-3) 1000 UNITS CAPS Take by mouth.    Historical Provider, MD  escitalopram (LEXAPRO) 20 MG tablet Take 20 mg by mouth daily.  Historical Provider, MD  Multiple Vitamins-Minerals (MULTIVITAMIN ADULT PO) Take by mouth.    Historical Provider, MD   Allergies  Allergen Reactions  . Codeine Sulfate   . Morphine And Related Other (See Comments)    Higher doses  - hallucinates  . Latex Palpitations    Skin irritation with latex bandaids    FAMILY HISTORY:  family history includes Dementia in his mother. SOCIAL HISTORY:  reports that he has never smoked. He has never used smokeless tobacco. He reports that he does not drink alcohol or use drugs.  REVIEW OF SYSTEMS:  Positives in BOLD Constitutional: fever, chills, weight loss, malaise/fatigue and diaphoresis.  HENT: Negative for hearing loss, ear pain, nosebleeds, congestion, sore throat, neck pain, tinnitus and ear discharge.   Eyes: Negative for blurred vision, double vision, photophobia, pain, discharge and redness.  Respiratory: cough, hemoptysis, sputum production, shortness of breath, wheezing and stridor.   Cardiovascular: Negative for chest pain, palpitations, orthopnea, claudication, leg swelling and PND.  Gastrointestinal: Negative for heartburn, nausea, vomiting, abdominal pain, diarrhea, constipation, blood in stool and melena.  Genitourinary: Negative for dysuria, urgency, frequency, hematuria and flank pain.  Musculoskeletal: Negative for myalgias, back pain, joint pain and falls.  Skin: Negative for itching and rash.  Neurological: Negative for dizziness, tingling, tremors, sensory change, speech change, focal weakness, seizures, loss of consciousness, weakness and headaches.  Endo/Heme/Allergies: Negative for environmental allergies and polydipsia. Does not bruise/bleed easily.  SUBJECTIVE:  Pt currently short of breath with wheezing on 2L O2 via nasal canula sitting on side of bed.  VITAL SIGNS: Temp:  [98 F (36.7 C)-99.6 F (37.6 C)] 98 F (36.7 C) (07/29 0755) Pulse Rate:  [59-110] 84 (07/29 0326) Resp:  [20-92] 92 (07/29 0751) BP: (121-155)/(65-88) 132/75 (07/29 0751) SpO2:  [89 %-94 %] 91 % (07/29 0326) Weight:  [245 lb (111.1 kg)] 245 lb (111.1 kg) (07/28 1200)  PHYSICAL EXAMINATION: General:  acutely ill appearing male, well developed Neuro: alert and oriented,  follows commands HEENT:  Supple, no JVD or lymphadenopathy  Cardiovascular:  s1s2, rrr, no M/R/G Lungs:  Diminished right mid and lower lobes, mild expiratory wheezes all other lobes, labored respirations Abdomen:  +BSx4, soft, non tender, non distended  Musculoskeletal:  Normal bulk and tone Skin:  Intact, no rashes or lesions   Recent Labs Lab 05/29/16 1205 05/30/16 0421  NA 141 139  K 3.8 4.1  CL 106 105  CO2 26 28  BUN 19 19  CREATININE 0.99 0.95  GLUCOSE 131* 121*    Recent Labs Lab 05/29/16 1205 05/30/16 0421  HGB 13.1 12.7*  HCT 38.6* 37.7*  WBC 14.4* 14.4*  PLT 352 328   Dg Chest 2 View  Result Date: 05/29/2016 CLINICAL DATA:  Pneumonia and shortness of breath. EXAM: CHEST  2 VIEW COMPARISON:  04/03/2009 FINDINGS: Cardiomediastinal silhouette is normal. Mediastinal contours appear intact. There is no evidence of pneumothorax. There is minimal left lung base atelectasis. There is a large right pleural effusion with right lower lobe atelectasis versus airspace consolidation. Osseous structures are without acute abnormality. Soft tissues are grossly normal. IMPRESSION: Large right pleural effusion with right lower lobe airspace consolidation versus atelectasis. Minimal atelectatic changes of the left lung base. Electronically Signed   By: Fidela Salisbury M.D.   On: 05/29/2016 13:22  Ct Chest Wo Contrast  Result Date: 05/29/2016 CLINICAL DATA:  75 year old male with shortness of breath and hypoxia. Patient on second round of antibiotics for recently diagnosed pneumonia. EXAM: CT CHEST  WITHOUT CONTRAST TECHNIQUE: Multidetector CT imaging of the chest was performed following the standard protocol without IV contrast. COMPARISON:  05/29/2016 and prior radiographs. 04/07/2013 abdominal and pelvic CT FINDINGS: Cardiovascular: Heart size is within normal limits. Coronary artery calcifications are identified. Thoracic aortic atherosclerotic calcifications are present without  evidence of aneurysm. Mediastinum/Nodes: Upper limits of normal sized mediastinal lymph nodes are identified. There is mild mediastinal shift to the left. Lungs/Pleura: A large right pleural effusion is noted with moderate to severe right lung atelectasis. No definite pleural masses are identified on this noncontrast study. Mild left basilar atelectasis is present. There is no evidence of pneumothorax or discrete mass. Upper Abdomen: No acute abnormality. Left renal cysts and angiomyolipoma again noted. Abdominal aortic atherosclerotic calcifications noted. Musculoskeletal: No acute or suspicious abnormalities identified. IMPRESSION: Large right pleural effusion with moderate to severe right lung atelectasis. No definite pleural abnormality identified on this noncontrast study. Coronary artery disease and thoracic aortic atherosclerosis. Abdominal aortic atherosclerosis. Electronically Signed   By: Margarette Canada M.D.   On: 05/29/2016 16:01   ASSESSMENT / PLAN: Acute Hypoxic Respiratory Failure Secondary to moderate to large right sided pleural effusion and ?CAP  Hx: Sleep Apnea  P: IR consulted for Diagnostic/Therapeutic Thoracentesis  Will follow results of fluid samples obtained from Thoracentesis  Continue empiric Ceftriaxone and Zithromax Echo Pending  Supplemental O2 to maintain O2 sats 92% or above  Prn Duonebs CPAP qhs  CXR in am  Marda Stalker, [place] Pager [phone] (please enter 7 digits) PCCM Consult Pager [phone] (please enter 7 digits)

## 2016-05-30 NOTE — Care Management Important Message (Signed)
Important Message  Patient Details  Name: Henry Lewis MRN: [id number] Date of Birth: [birth year]   Medicare Important Message Given:  Yes    Fady Stamps A, RN 05/30/2016, 3:23 PM

## 2016-05-30 NOTE — Progress Notes (Signed)
*  PRELIMINARY RESULTS* Echocardiogram 2D Echocardiogram has been performed.  Henry Lewis 05/30/2016, 2:09 PM

## 2016-05-30 NOTE — Procedures (Signed)
Successful right thoracentesis with ultrasound guidance.  Removed 2 liters of amber colored fluid.   No immediate complication.  CXR pending.

## 2016-05-31 ENCOUNTER — Inpatient Hospital Stay: Payer: Medicare Other

## 2016-05-31 LAB — CBC
HCT: 38 % — ABNORMAL LOW (ref 40.0–52.0)
HEMOGLOBIN: 13 g/dL (ref 13.0–18.0)
MCH: 30.5 pg (ref 26.0–34.0)
MCHC: 34.2 g/dL (ref 32.0–36.0)
MCV: 89.4 fL (ref 80.0–100.0)
PLATELETS: 354 10*3/uL (ref 150–440)
RBC: 4.24 MIL/uL — AB (ref 4.40–5.90)
RDW: 13.9 % (ref 11.5–14.5)
WBC: 15.5 10*3/uL — AB (ref 3.8–10.6)

## 2016-05-31 MED ORDER — BISACODYL 10 MG RE SUPP
10.0000 mg | Freq: Every day | RECTAL | Status: DC | PRN
  Administered 2016-05-31: 10 mg via RECTAL
  Filled 2016-05-31: qty 1

## 2016-05-31 MED ORDER — LACTULOSE 10 GM/15ML PO SOLN
30.0000 g | Freq: Two times a day (BID) | ORAL | Status: DC | PRN
  Administered 2016-05-31: 30 g via ORAL
  Filled 2016-05-31: qty 60

## 2016-05-31 NOTE — Progress Notes (Addendum)
* Henry Lewis     Assessment and Plan: -Acute Hypoxic Respiratory Failure Secondary  to large right sided pleural effusion; -now improved status post thoracentesis with removal of approximately 2 L of fluid.  -Large right-sided pleural effusion, review of the studies appears to be consistent with exudative pleural effusion. We'll await cultures and cytology reports.  -Sleep Apnea  Nocturnal CPAP.  I explained to the patient and his wife that his repeat chest x-ray shows small residual effusion, I do not think that the effusion has yet significantly reaccumulated. I will order a repeat thoracentesis to be done by interventional radiology, subsequently the patient could be discharged if he remains stable. The patient will be following up with my office in approximately one week with a repeat chest x-ray to look for reaccumulation of the fluid. I explained that it may take 2 or more arch volume thoracentesis to identify the source of the fluid, and may involve biopsy in the future if the fluid studies are unrevealing. I also instructed him to call our office next week. If he should become short of breath before his scheduled follow-up.    Date: 05/31/2016  MRN# [id number] Henry Lewis 1941/05/14   Henry Lewis is a 75 y.o. old male seen in follow up for chief complaint of  Chief Complaint  Patient presents with  . Shortness of Breath     HPI:   Patient is significantly better since fluid drainage. Review of chest x-ray images. This morning. In comparison with previous show continued improvement of the large right-sided pleural effusion which is now nearly completely resolved. The right-sided diaphragm appears to be elevated.  Medication:   Reviewed  Allergies:  Codeine sulfate; Morphine and related; and Latex  Review of Systems: Gen:  Denies  fever, sweats. HEENT: Denies blurred vision. Cvc:  No dizziness, chest pain or heaviness Resp:   Denies cough  or sputum porduction. Gi: Denies swallowing difficulty, stomach pain.  Gu:  Denies bladder incontinence, burning urine Ext:   No Joint pain, stiffness. Skin: No skin rash, easy bruising. Endoc:  No polyuria, polydipsia. Psych: No depression, insomnia. Other:  All other systems were reviewed and found to be negative other than what is mentioned in the HPI.   Physical Examination:   VS: BP 116/67 (BP Location: Left Arm)   Pulse 91   Temp 98.5 F (36.9 C) (Oral)   Resp 20   Ht 6' (1.829 m)   Wt 245 lb (111.1 kg)   SpO2 95%   BMI 33.23 kg/m   General Appearance: No distress  Neuro:without focal findings,  speech normal,  HEENT: PERRLA, EOM intact. Pulmonary: normal breath sounds, No wheezing decreased air entry left base, but improved compared with previous.   CardiovascularNormal S1,S2.  No m/r/g.   Abdomen: Benign, Soft, non-tender. Renal:  No costovertebral tenderness  GU:  Not performed at this time. Endoc: No evident thyromegaly, no signs of acromegaly. Skin:   warm, no rash. Extremities: normal, no cyanosis, clubbing.   LABORATORY PANEL:   CBC  Recent Labs Lab 05/31/16 0459  WBC 15.5*  HGB 13.0  HCT 38.0*  PLT 354   ------------------------------------------------------------------------------------------------------------------  Chemistries   Recent Labs Lab 05/29/16 1205 05/30/16 0421  NA 141 139  K 3.8 4.1  CL 106 105  CO2 26 28  GLUCOSE 131* 121*  BUN 19 19  CREATININE 0.99 0.95  CALCIUM 9.0 8.4*  AST 48*  --   ALT 55  --  ALKPHOS 145*  --   BILITOT 0.6  --    ------------------------------------------------------------------------------------------------------------------  Cardiac Enzymes  Recent Labs Lab 05/29/16 1205  TROPONINI <0.03   ------------------------------------------------------------  RADIOLOGY:   No results found for this or any previous visit. Results for orders placed during the hospital encounter of 05/29/16    DG Chest 2 View   Narrative CLINICAL DATA:  Pneumonia and shortness of breath. EXAM: CHEST  2 VIEW COMPARISON:  04/03/2009 FINDINGS: Cardiomediastinal silhouette is normal. Mediastinal contours appear intact. There is no evidence of pneumothorax. There is minimal left lung base atelectasis. There is a large right pleural effusion with right lower lobe atelectasis versus airspace consolidation. Osseous structures are without acute abnormality. Soft tissues are grossly normal. IMPRESSION: Large right pleural effusion with right lower lobe airspace consolidation versus atelectasis. Minimal atelectatic changes of the left lung base. Electronically Signed   By: Fidela Salisbury M.D.   On: 05/29/2016 13:22   ------------------------------------------------------------------------------------------------------------------  Thank  you for allowing [facility] to assist in the care of your patient. Our recommendations are noted above.  Please contact us if we can be of further service.   Henry Stalker, MD.  [facility] Office Number: [phone]  Henry Lewis, M.D.  Henry Lewis, M.D.  Henry Lewis, M.D  05/31/2016

## 2016-05-31 NOTE — Progress Notes (Signed)
[facility] [place] NAME: Henry Lewis    MR#:  [id number]  DATE OF BIRTH:  June 20, 1941  SUBJECTIVE:   Pt. Here due to shortness of breath from right sided pleural effusion. S/p Thoracentesis today and 2 L of fluid removed. Clinically feels better.    REVIEW OF SYSTEMS:    Review of Systems  Constitutional: Negative for chills and fever.  HENT: Negative for congestion and tinnitus.   Eyes: Negative for blurred vision and double vision.  Respiratory: Positive for shortness of breath. Negative for cough and wheezing.   Cardiovascular: Positive for chest pain (Pleuritic). Negative for orthopnea and PND.  Gastrointestinal: Negative for abdominal pain, diarrhea, nausea and vomiting.  Genitourinary: Negative for dysuria and hematuria.  Neurological: Negative for dizziness, sensory change and focal weakness.  All other systems reviewed and are negative.   Nutrition: Heart Health/Carb control Tolerating Diet: Yes Tolerating PT: Await eval.    DRUG ALLERGIES:   Allergies  Allergen Reactions  . Codeine Sulfate   . Morphine And Related Other (See Comments)    Higher doses - hallucinates  . Latex Palpitations    Skin irritation with latex bandaids    VITALS:  Blood pressure 116/67, pulse 91, temperature 98.5 F (36.9 C), temperature source Oral, resp. rate 20, height 6' (1.829 m), weight 111.1 kg (245 lb), SpO2 95 %.  PHYSICAL EXAMINATION:   Physical Exam  GENERAL:  75 y.o.-year-old patient lying in the bed in NAD. EYES: Pupils equal, round, reactive to light and accommodation. No scleral icterus. Extraocular muscles intact.  HEENT: Head atraumatic, normocephalic. Oropharynx and nasopharynx clear.  NECK:  Supple, no jugular venous distention. No thyroid enlargement, no tenderness.  LUNGS: Poor air entry on the right lower lung fields. + Dullness to percussion. Negative Use of accessory muscles.  No rhonchi, wheezing CARDIOVASCULAR:  S1, S2 normal. No murmurs, rubs, or gallops.  ABDOMEN: Soft, nontender, nondistended. Bowel sounds present. No organomegaly or mass.  EXTREMITIES: No cyanosis, clubbing or edema b/l.    NEUROLOGIC: Cranial nerves II through XII are intact. No focal Motor or sensory deficits b/l.   PSYCHIATRIC: The patient is alert and oriented x 3.  SKIN: No obvious rash, lesion, or ulcer.    LABORATORY PANEL:   CBC  Recent Labs Lab 05/31/16 0459  WBC 15.5*  HGB 13.0  HCT 38.0*  PLT 354   ------------------------------------------------------------------------------------------------------------------  Chemistries   Recent Labs Lab 05/29/16 1205 05/30/16 0421  NA 141 139  K 3.8 4.1  CL 106 105  CO2 26 28  GLUCOSE 131* 121*  BUN 19 19  CREATININE 0.99 0.95  CALCIUM 9.0 8.4*  AST 48*  --   ALT 55  --   ALKPHOS 145*  --   BILITOT 0.6  --    ------------------------------------------------------------------------------------------------------------------  Cardiac Enzymes  Recent Labs Lab 05/29/16 1205  TROPONINI <0.03   ------------------------------------------------------------------------------------------------------------------  RADIOLOGY:  Dg Chest 1 View  Result Date: 05/30/2016 CLINICAL DATA:  Status post right thoracentesis. EXAM: CHEST 1 VIEW COMPARISON:  05/29/2016 FINDINGS: Right pleural effusion has decreased. There is no evidence of pneumothorax. Right lower lung atelectasis again noted. Cardiomediastinal silhouette is unchanged. No other changes identified. IMPRESSION: Decreased right pleural effusion status post right thoracentesis. No evidence of pneumothorax. Continued right lower lung atelectasis. Electronically Signed   By: Margarette Canada M.D.   On: 05/30/2016 15:32  Dg Chest 2 View  Result Date: 05/29/2016 CLINICAL DATA:  Pneumonia and shortness of breath.  EXAM: CHEST  2 VIEW COMPARISON:  04/03/2009 FINDINGS: Cardiomediastinal silhouette is normal. Mediastinal  contours appear intact. There is no evidence of pneumothorax. There is minimal left lung base atelectasis. There is a large right pleural effusion with right lower lobe atelectasis versus airspace consolidation. Osseous structures are without acute abnormality. Soft tissues are grossly normal. IMPRESSION: Large right pleural effusion with right lower lobe airspace consolidation versus atelectasis. Minimal atelectatic changes of the left lung base. Electronically Signed   By: Fidela Salisbury M.D.   On: 05/29/2016 13:22  Ct Chest Wo Contrast  Result Date: 05/29/2016 CLINICAL DATA:  75 year old male with shortness of breath and hypoxia. Patient on second round of antibiotics for recently diagnosed pneumonia. EXAM: CT CHEST WITHOUT CONTRAST TECHNIQUE: Multidetector CT imaging of the chest was performed following the standard protocol without IV contrast. COMPARISON:  05/29/2016 and prior radiographs. 04/07/2013 abdominal and pelvic CT FINDINGS: Cardiovascular: Heart size is within normal limits. Coronary artery calcifications are identified. Thoracic aortic atherosclerotic calcifications are present without evidence of aneurysm. Mediastinum/Nodes: Upper limits of normal sized mediastinal lymph nodes are identified. There is mild mediastinal shift to the left. Lungs/Pleura: A large right pleural effusion is noted with moderate to severe right lung atelectasis. No definite pleural masses are identified on this noncontrast study. Mild left basilar atelectasis is present. There is no evidence of pneumothorax or discrete mass. Upper Abdomen: No acute abnormality. Left renal cysts and angiomyolipoma again noted. Abdominal aortic atherosclerotic calcifications noted. Musculoskeletal: No acute or suspicious abnormalities identified. IMPRESSION: Large right pleural effusion with moderate to severe right lung atelectasis. No definite pleural abnormality identified on this noncontrast study. Coronary artery disease and  thoracic aortic atherosclerosis. Abdominal aortic atherosclerosis. Electronically Signed   By: Margarette Canada M.D.   On: 05/29/2016 16:01  Dg Chest Port 1 View  Result Date: 05/31/2016 CLINICAL DATA:  Acute respiratory failure. EXAM: PORTABLE CHEST 1 VIEW COMPARISON:  05/30/2016 and prior exams FINDINGS: A right pleural effusion and right lower lung atelectasis again noted Mild left basilar atelectasis again identified. There is no evidence of pneumothorax. The cardiomediastinal silhouette is unchanged. IMPRESSION: Unchanged appearance of the chest with right pleural effusion and and right lower lung atelectasis. Electronically Signed   By: Margarette Canada M.D.   On: 05/31/2016 07:24  US Thoracentesis Asp Pleural Space W/img Guide  Result Date: 05/30/2016 INDICATION: Respiratory distress and large right pleural effusion. EXAM: ULTRASOUND GUIDED RIGHT THORACENTESIS MEDICATIONS: None. COMPLICATIONS: None immediate. PROCEDURE: An ultrasound guided thoracentesis was thoroughly discussed with the patient and questions answered. The benefits, risks, alternatives and complications were also discussed. The patient understands and wishes to proceed with the procedure. Written consent was obtained. Ultrasound was performed to localize and mark an adequate pocket of fluid in the right posterior chest. The area was then prepped and draped in the normal sterile fashion. 1% Lidocaine was used for local anesthesia. Under ultrasound guidance a 6 Fr Safe-T-Centesis catheter was introduced. Thoracentesis was performed. The catheter was removed and a dressing applied. FINDINGS: A total of approximately 2 L of amber colored fluid was removed. Samples were sent to the laboratory as requested by the clinical team. IMPRESSION: Successful ultrasound guided right thoracentesis yielding 2 L of pleural fluid. Electronically Signed   By: Markus Daft M.D.   On: 05/30/2016 15:47    ASSESSMENT AND PLAN:   75 year old male with past medical  history of hypertension, chronic kidney disease stage III, history of renal cell carcinoma status post nephrectomy, who presents to  the hospital due to shortness of breath and noted to be in acute respiratory failure with hypoxia.  1. Acute respiratory failure with hypoxia-this is secondary to the pleural effusion as seen on CT chest. -Continue supportive care with O2 support for now.  -Status post ultrasound-guided thoracentesis with 2 L of fluid removed. Feels better today.  2. Pleural effusion-this is the cause of patient's respiratory failure with hypoxia.  Chest patient is status post ultrasound-guided thoracentesis with 2 L of fluid removed. Patient has over 10,000 cells in his pleural fluid with lymphocytic predominance. Etiology still unclear. Exudative effusion. -Questionable malignant but await cytology and fluid. Continue empiric antibiotics. -Discussed with pulmonary and patient likely will need a repeat thoracentesis tomorrow as CXR today showing not much improvement in effusion.   3. Leukocytosis-secondary to the pleural effusion/suspected pneumonia. -Continue IV antibiotics as mentioned above and stable and will monitor.   4. Essential hypertension-continue Lisinopril/HCTZ.  5. Hx of Gout - no acute attack - cont. Allopurinol.    All the records are reviewed and case discussed with Care Management/Social Workerr. Management plans discussed with the patient, family and they are in agreement.  CODE STATUS: Full Code  DVT Prophylaxis: TED's SCD's  TOTAL TIME TAKING CARE OF THIS PATIENT: 30 minutes.   POSSIBLE D/C IN 2-3 DAYS, DEPENDING ON CLINICAL CONDITION.   Henreitta Leber M.D on 05/31/2016 at 12:22 PM  Between 7am to 6pm - Pager - [phone]  After 6pm go to [email or web address] - password [email or web address] [facility]  Office  [phone]  CC: Primary care physician; Madelyn Brunner, MD

## 2016-06-01 ENCOUNTER — Encounter: Payer: Self-pay | Admitting: Internal Medicine

## 2016-06-01 ENCOUNTER — Inpatient Hospital Stay: Payer: Medicare Other

## 2016-06-01 ENCOUNTER — Telehealth: Payer: Self-pay | Admitting: *Deleted

## 2016-06-01 DIAGNOSIS — J90 Pleural effusion, not elsewhere classified: Secondary | ICD-10-CM

## 2016-06-01 LAB — CBC
HEMATOCRIT: 37.6 % — AB (ref 40.0–52.0)
HEMOGLOBIN: 12.9 g/dL — AB (ref 13.0–18.0)
MCH: 30.6 pg (ref 26.0–34.0)
MCHC: 34.2 g/dL (ref 32.0–36.0)
MCV: 89.4 fL (ref 80.0–100.0)
Platelets: 362 10*3/uL (ref 150–440)
RBC: 4.21 MIL/uL — AB (ref 4.40–5.90)
RDW: 13.9 % (ref 11.5–14.5)
WBC: 16.8 10*3/uL — ABNORMAL HIGH (ref 3.8–10.6)

## 2016-06-01 MED ORDER — LEVOFLOXACIN 500 MG PO TABS: 500.0000 mg | ORAL_TABLET | Freq: Every day | ORAL | 0 refills | Status: DC

## 2016-06-01 NOTE — Discharge Planning (Signed)
Patient IV removed.  DC papers given, explained and educated.  Pt reminded to call 911 or return to hospital with any increase breathing difficulties.  Told of suggested FU appts and also given script.  RN assessment and VS revealed stability for DC to home.  When ready, patient will be wheeled to front and family is transporting via car to home.

## 2016-06-01 NOTE — Telephone Encounter (Signed)
-----   Message from Laverle Hobby, MD sent at 05/31/2016  2:12 PM EDT ----- Regarding: hfu Pt needs fu with me in about 1 week with 2 view CXR. Ok to [place] re: pleural effusion.

## 2016-06-01 NOTE — Telephone Encounter (Signed)
Spoke with Henry Lewis on the unit and gave hosp f/u appt and informed pt needs cxr prior to appt. CXR order placed.

## 2016-06-01 NOTE — Discharge Summary (Signed)
[employer] [facility] at [place] NAME: Henry Lewis    MR#:  [id number]  DATE OF BIRTH:  1941/05/30  DATE OF ADMISSION:  05/29/2016 ADMITTING PHYSICIAN: Henreitta Leber, MD  DATE OF DISCHARGE: 06/01/2016  PRIMARY CARE PHYSICIAN: Madelyn Brunner, MD    ADMISSION DIAGNOSIS:  Pleural effusion [J90] Hypoxia [R09.02] Pleural effusion on right [J94.8]  DISCHARGE DIAGNOSIS:  Active Problems:   Acute respiratory failure with hypoxia ([place])   SECONDARY DIAGNOSIS:   Past Medical History:  Diagnosis Date  . Chronic kidney disease   . Hypertension   . renal ca ( LT ) 12/2005   lt rfa  . rt renal ca dx'd 2006   rt nephrectomy  . Sleep apnea     HOSPITAL COURSE:   75 year old male with past medical history of hypertension, chronic kidney disease stage III, history of renal cell carcinoma status post nephrectomy, who presents to the hospital due to shortness of breath and noted to be in acute respiratory failure with hypoxia.  1. Acute respiratory failure with hypoxia-this was secondary to the pleural effusion as seen on CT chest. -Patient was admitted to the hospital and underwent ultrasound-guided thoracentesis twice while in the hospital. He has had about 3.5 L of fluid removed. He was ambulated on room air did not require oxygen and is now being discharged home.  2. Pleural effusion-this was the cause of patient's respiratory failure with hypoxia.  -Patient is status post ultrasound-guided thoracentesis 2 with about 3.5 L of fluid removed. The fluid is transudative in nature but the exact etiology still unclear. Patient does have a inflammatory fluid with greater than 10,000 cells with a lymphocytic predominance. -Empirically patient will be discharged on oral Levaquin for additional 7 days and received IV ceftriaxone and Zithromax while in the hospital. -He was seen by pulmonary and will follow up with them next week for further results of his  pleural studies and a follow-up chest x-ray. Patient's pleural fluid cytology still currently pending.  3. Leukocytosis-secondary to the pleural effusion/suspected pneumonia. -This can be further followed as an outpatient. His white cell count remains mildly elevated but he is afebrile and hemodynamically stable. He will be discharged on oral Levaquin.  4. Essential hypertension- he will continue Lisinopril/HCTZ.  5. Hx of Gout - no acute attack - he will cont. Allopurinol.   DISCHARGE CONDITIONS:   Stable  CONSULTS OBTAINED:    DRUG ALLERGIES:   Allergies  Allergen Reactions  . Codeine Sulfate   . Morphine And Related Other (See Comments)    Higher doses - hallucinates  . Latex Palpitations    Skin irritation with latex bandaids    DISCHARGE MEDICATIONS:   Current Discharge Medication List    START taking these medications   Details  levofloxacin (LEVAQUIN) 500 MG tablet Take 1 tablet (500 mg total) by mouth daily. Qty: 7 tablet, Refills: 0      CONTINUE these medications which have NOT CHANGED   Details  allopurinol (ZYLOPRIM) 300 MG tablet Take 300 mg by mouth daily.    lisinopril-hydrochlorothiazide (PRINZIDE,ZESTORETIC) 20-25 MG tablet Take 1 tablet by mouth daily.    aspirin 81 MG tablet Take 81 mg by mouth daily.    Boswellia-Glucosamine-Vit D (GLUCOSAMINE COMPLEX PO) Take by mouth.    Cholecalciferol (VITAMIN D-3) 1000 UNITS CAPS Take by mouth.    escitalopram (LEXAPRO) 20 MG tablet Take 20 mg by mouth daily.    Multiple Vitamins-Minerals (MULTIVITAMIN ADULT PO)  Take by mouth.      STOP taking these medications     azithromycin (ZITHROMAX) 500 MG tablet          DISCHARGE INSTRUCTIONS:   DIET:  Cardiac diet  DISCHARGE CONDITION:  Stable  ACTIVITY:  Activity as tolerated  OXYGEN:  Home Oxygen: No.   Oxygen Delivery: room air  DISCHARGE LOCATION:  home   If you experience worsening of your admission symptoms, develop shortness  of breath, life threatening emergency, suicidal or homicidal thoughts you must seek medical attention immediately by calling 911 or calling your MD immediately  if symptoms less severe.  You Must read complete instructions/literature along with all the possible adverse reactions/side effects for all the Medicines you take and that have been prescribed to you. Take any new Medicines after you have completely understood and accpet all the possible adverse reactions/side effects.   Please note  You were cared for by a hospitalist during your hospital stay. If you have any questions about your discharge medications or the care you received while you were in the hospital after you are discharged, you can call the unit and asked to speak with the hospitalist on call if the hospitalist that took care of you is not available. Once you are discharged, your primary care physician will handle any further medical issues. Please note that NO REFILLS for any discharge medications will be authorized once you are discharged, as it is imperative that you return to your primary care physician (or establish a relationship with a primary care physician if you do not have one) for your aftercare needs so that they can reassess your need for medications and monitor your lab values.     Today   Shortness of breath improved.  Pleurisy has improved.   VITAL SIGNS:  Blood pressure (!) 110/56, pulse (!) 102, temperature 98.2 F (36.8 C), temperature source Oral, resp. rate 18, height 6' (1.829 m), weight 111.1 kg (245 lb), SpO2 92 %.  I/O:   Intake/Output Summary (Last 24 hours) at 06/01/16 1340 Last data filed at 06/01/16 0600  Gross per 24 hour  Intake              485 ml  Output                0 ml  Net              485 ml    PHYSICAL EXAMINATION:   GENERAL:  75 y.o.-year-old patient lying in the bed in NAD. EYES: Pupils equal, round, reactive to light and accommodation. No scleral icterus. Extraocular  muscles intact.  HEENT: Head atraumatic, normocephalic. Oropharynx and nasopharynx clear.  NECK:  Supple, no jugular venous distention. No thyroid enlargement, no tenderness.  LUNGS: Improved entry in the Right upper and mid lung fields,  Negative Use of accessory muscles.  No rhonchi, wheezing, rales. CARDIOVASCULAR: S1, S2 normal. No murmurs, rubs, or gallops.  ABDOMEN: Soft, nontender, nondistended. Bowel sounds present. No organomegaly or mass.  EXTREMITIES: No cyanosis, clubbing or edema b/l.    NEUROLOGIC: Cranial nerves II through XII are intact. No focal Motor or sensory deficits b/l.   PSYCHIATRIC: The patient is alert and oriented x 3. Good affect.  SKIN: No obvious rash, lesion, or ulcer.    DATA REVIEW:   CBC  Recent Labs Lab 06/01/16 0410  WBC 16.8*  HGB 12.9*  HCT 37.6*  PLT 362    Chemistries   Recent Labs Lab 05/29/16  1205 05/30/16 0421  NA 141 139  K 3.8 4.1  CL 106 105  CO2 26 28  GLUCOSE 131* 121*  BUN 19 19  CREATININE 0.99 0.95  CALCIUM 9.0 8.4*  AST 48*  --   ALT 55  --   ALKPHOS 145*  --   BILITOT 0.6  --     Cardiac Enzymes  Recent Labs Lab 05/29/16 1205  TROPONINI <0.03    Microbiology Results  Results for orders placed or performed during the hospital encounter of 05/29/16  Culture, blood (Routine X 2) w Reflex to ID Panel     Status: None (Preliminary result)   Collection Time: 05/29/16  4:23 PM  Result Value Ref Range Status   Specimen Description BLOOD RIGHT HAND  Final   Special Requests BOTTLES DRAWN AEROBIC AND ANAEROBIC  1CC  Final   Culture NO GROWTH 2 DAYS  Final   Report Status PENDING  Incomplete  Culture, blood (Routine X 2) w Reflex to ID Panel     Status: None (Preliminary result)   Collection Time: 05/29/16  4:23 PM  Result Value Ref Range Status   Specimen Description BLOOD RIGHT ANTECUBITAL  Final   Special Requests BOTTLES DRAWN AEROBIC AND ANAEROBIC  5CC  Final   Culture NO GROWTH 2 DAYS  Final   Report  Status PENDING  Incomplete  Culture, body fluid-bottle     Status: None (Preliminary result)   Collection Time: 05/30/16  5:52 PM  Result Value Ref Range Status   Specimen Description PLEURAL RIGHT  Final   Special Requests NONE  Final   Culture   Final    NO GROWTH < 24 HOURS Performed at [facility]    Report Status PENDING  Incomplete  Gram stain     Status: None   Collection Time: 05/30/16  5:52 PM  Result Value Ref Range Status   Specimen Description PLEURAL RIGHT  Final   Special Requests NONE  Final   Gram Stain   Final    MODERATE WBC PRESENT, PREDOMINANTLY MONONUCLEAR NO ORGANISMS SEEN Performed at [facility]    Report Status 05/30/2016 FINAL  Final    RADIOLOGY:  Dg Chest 1 View  Result Date: 06/01/2016 CLINICAL DATA:  Status post right thoracentesis EXAM: CHEST 1 VIEW COMPARISON:  05/31/2016 FINDINGS: Cardiac shadow is stable. The left lung remains clear. The right lung demonstrates some basilar atelectasis/consolidation similar to that seen on recent CT examination. Small residual pleural effusion is noted likely related to the known loculations. No pneumothorax is seen. IMPRESSION: No pneumothorax following thoracentesis. Electronically Signed   By: Inez Catalina M.D.   On: 06/01/2016 09:44  Dg Chest 1 View  Result Date: 05/30/2016 CLINICAL DATA:  Status post right thoracentesis. EXAM: CHEST 1 VIEW COMPARISON:  05/29/2016 FINDINGS: Right pleural effusion has decreased. There is no evidence of pneumothorax. Right lower lung atelectasis again noted. Cardiomediastinal silhouette is unchanged. No other changes identified. IMPRESSION: Decreased right pleural effusion status post right thoracentesis. No evidence of pneumothorax. Continued right lower lung atelectasis. Electronically Signed   By: Margarette Canada M.D.   On: 05/30/2016 15:32  Dg Chest Port 1 View  Result Date: 05/31/2016 CLINICAL DATA:  Acute respiratory failure. EXAM: PORTABLE CHEST 1 VIEW  COMPARISON:  05/30/2016 and prior exams FINDINGS: A right pleural effusion and right lower lung atelectasis again noted Mild left basilar atelectasis again identified. There is no evidence of pneumothorax. The cardiomediastinal silhouette is unchanged. IMPRESSION: Unchanged appearance  of the chest with right pleural effusion and and right lower lung atelectasis. Electronically Signed   By: Margarette Canada M.D.   On: 05/31/2016 07:24  US Thoracentesis Asp Pleural Space W/img Guide  Result Date: 06/01/2016 INDICATION: Recurrent right-sided pleural effusion EXAM: ULTRASOUND GUIDED THORACENTESIS MEDICATIONS: None. ANESTHESIA/SEDATION: None COMPLICATIONS: None immediate. PROCEDURE: An ultrasound guided thoracentesis was thoroughly discussed with the patient and questions answered. The benefits, risks, alternatives and complications were also discussed. The patient understands and wishes to proceed with the procedure. Written consent was obtained. Ultrasound was performed to localize and mark an adequate pocket of fluid in the right chest. The area was then prepped and draped in the normal sterile fashion. 1% Lidocaine was used for local anesthesia. Under ultrasound guidance a 6 French thoracentesis catheter was introduced. Thoracentesis was performed. The catheter was removed and a dressing applied. FINDINGS: A total of approximately 1.5 L of amber colored fluid was removed. A fluid sample was sent for laboratory analysis. IMPRESSION: Successful ultrasound guided right thoracentesis yielding 1.5 L of pleural fluid. It should be noted that the fluid appeared somewhat loculated on today's examination. If the pleural effusion recurs and increasing loculations are noted, a more aggressive large bore catheter therapy may be necessary. Electronically Signed   By: Inez Catalina M.D.   On: 06/01/2016 09:43  US Thoracentesis Asp Pleural Space W/img Guide  Result Date: 05/30/2016 INDICATION: Respiratory distress and large right  pleural effusion. EXAM: ULTRASOUND GUIDED RIGHT THORACENTESIS MEDICATIONS: None. COMPLICATIONS: None immediate. PROCEDURE: An ultrasound guided thoracentesis was thoroughly discussed with the patient and questions answered. The benefits, risks, alternatives and complications were also discussed. The patient understands and wishes to proceed with the procedure. Written consent was obtained. Ultrasound was performed to localize and mark an adequate pocket of fluid in the right posterior chest. The area was then prepped and draped in the normal sterile fashion. 1% Lidocaine was used for local anesthesia. Under ultrasound guidance a 6 Fr Safe-T-Centesis catheter was introduced. Thoracentesis was performed. The catheter was removed and a dressing applied. FINDINGS: A total of approximately 2 L of amber colored fluid was removed. Samples were sent to the laboratory as requested by the clinical team. IMPRESSION: Successful ultrasound guided right thoracentesis yielding 2 L of pleural fluid. Electronically Signed   By: Markus Daft M.D.   On: 05/30/2016 15:47     Management plans discussed with the patient, family and they are in agreement.  CODE STATUS:     Code Status Orders        Start     Ordered   05/29/16 1849  Full code  Continuous     05/29/16 1848    Code Status History    Date Active Date Inactive Code Status Order ID Comments User Context   05/29/2016  6:48 PM 05/30/2016  1:38 PM Full Code [id number]  Henreitta Leber, MD Inpatient      TOTAL TIME TAKING CARE OF THIS PATIENT: 40 minutes.    Henreitta Leber M.D on 06/01/2016 at 1:40 PM  Between 7am to 6pm - Pager - [phone]  After 6pm go to [email or web address] - password [email or web address] [facility]  Office  [phone]  CC: Primary care physician; Madelyn Brunner, MD

## 2016-06-01 NOTE — Discharge Planning (Signed)
Patient ambulated on RA and maintained above 90% SPO2.  Patient will not need O2 if discharged today.

## 2016-06-01 NOTE — Procedures (Signed)
US thoracentesis on right  Complications:  None  Blood Loss: none  See dictation in canopy pacs  

## 2016-06-02 ENCOUNTER — Encounter: Payer: Self-pay | Admitting: Internal Medicine

## 2016-06-02 LAB — PATHOLOGIST SMEAR REVIEW

## 2016-06-03 LAB — CULTURE, BLOOD (ROUTINE X 2)
Culture: NO GROWTH
Culture: NO GROWTH

## 2016-06-04 ENCOUNTER — Telehealth: Payer: Self-pay | Admitting: Internal Medicine

## 2016-06-04 ENCOUNTER — Emergency Department: Payer: Medicare Other

## 2016-06-04 ENCOUNTER — Inpatient Hospital Stay
Admission: EM | Admit: 2016-06-04 | Discharge: 2016-06-08 | DRG: 186 | Disposition: A | Discharge status: Other Acute Inpatient Hospital | Payer: Medicare Other | Attending: Internal Medicine | Admitting: Internal Medicine

## 2016-06-04 ENCOUNTER — Encounter: Payer: Self-pay | Admitting: Emergency Medicine

## 2016-06-04 DIAGNOSIS — I130 Hypertensive heart and chronic kidney disease with heart failure and stage 1 through stage 4 chronic kidney disease, or unspecified chronic kidney disease: Secondary | ICD-10-CM | POA: Diagnosis present

## 2016-06-04 DIAGNOSIS — Z885 Allergy status to narcotic agent status: Secondary | ICD-10-CM

## 2016-06-04 DIAGNOSIS — N189 Chronic kidney disease, unspecified: Secondary | ICD-10-CM | POA: Diagnosis present

## 2016-06-04 DIAGNOSIS — I5030 Unspecified diastolic (congestive) heart failure: Secondary | ICD-10-CM | POA: Diagnosis present

## 2016-06-04 DIAGNOSIS — J90 Pleural effusion, not elsewhere classified: Principal | ICD-10-CM | POA: Diagnosis present

## 2016-06-04 DIAGNOSIS — J9690 Respiratory failure, unspecified, unspecified whether with hypoxia or hypercapnia: Secondary | ICD-10-CM

## 2016-06-04 DIAGNOSIS — R6511 Systemic inflammatory response syndrome (SIRS) of non-infectious origin with acute organ dysfunction: Secondary | ICD-10-CM | POA: Diagnosis present

## 2016-06-04 DIAGNOSIS — E440 Moderate protein-calorie malnutrition: Secondary | ICD-10-CM | POA: Diagnosis present

## 2016-06-04 DIAGNOSIS — I959 Hypotension, unspecified: Secondary | ICD-10-CM | POA: Diagnosis present

## 2016-06-04 DIAGNOSIS — R6510 Systemic inflammatory response syndrome (SIRS) of non-infectious origin without acute organ dysfunction: Secondary | ICD-10-CM

## 2016-06-04 DIAGNOSIS — Z7982 Long term (current) use of aspirin: Secondary | ICD-10-CM

## 2016-06-04 DIAGNOSIS — R001 Bradycardia, unspecified: Secondary | ICD-10-CM | POA: Diagnosis present

## 2016-06-04 DIAGNOSIS — G4733 Obstructive sleep apnea (adult) (pediatric): Secondary | ICD-10-CM | POA: Diagnosis present

## 2016-06-04 DIAGNOSIS — A419 Sepsis, unspecified organism: Secondary | ICD-10-CM | POA: Diagnosis present

## 2016-06-04 DIAGNOSIS — Z79899 Other long term (current) drug therapy: Secondary | ICD-10-CM

## 2016-06-04 DIAGNOSIS — R0602 Shortness of breath: Secondary | ICD-10-CM

## 2016-06-04 DIAGNOSIS — N179 Acute kidney failure, unspecified: Secondary | ICD-10-CM | POA: Diagnosis present

## 2016-06-04 DIAGNOSIS — R740 Nonspecific elevation of levels of transaminase and lactic acid dehydrogenase [LDH]: Secondary | ICD-10-CM | POA: Diagnosis present

## 2016-06-04 DIAGNOSIS — J9601 Acute respiratory failure with hypoxia: Secondary | ICD-10-CM | POA: Diagnosis present

## 2016-06-04 DIAGNOSIS — E785 Hyperlipidemia, unspecified: Secondary | ICD-10-CM | POA: Diagnosis present

## 2016-06-04 DIAGNOSIS — Z96653 Presence of artificial knee joint, bilateral: Secondary | ICD-10-CM | POA: Diagnosis present

## 2016-06-04 DIAGNOSIS — Z85528 Personal history of other malignant neoplasm of kidney: Secondary | ICD-10-CM | POA: Diagnosis not present

## 2016-06-04 DIAGNOSIS — R5381 Other malaise: Secondary | ICD-10-CM

## 2016-06-04 DIAGNOSIS — Z905 Acquired absence of kidney: Secondary | ICD-10-CM

## 2016-06-04 DIAGNOSIS — N289 Disorder of kidney and ureter, unspecified: Secondary | ICD-10-CM

## 2016-06-04 DIAGNOSIS — Z91040 Latex allergy status: Secondary | ICD-10-CM | POA: Diagnosis not present

## 2016-06-04 DIAGNOSIS — Z8701 Personal history of pneumonia (recurrent): Secondary | ICD-10-CM

## 2016-06-04 DIAGNOSIS — R0600 Dyspnea, unspecified: Secondary | ICD-10-CM

## 2016-06-04 DIAGNOSIS — J869 Pyothorax without fistula: Secondary | ICD-10-CM | POA: Diagnosis not present

## 2016-06-04 DIAGNOSIS — Z96661 Presence of right artificial ankle joint: Secondary | ICD-10-CM | POA: Diagnosis present

## 2016-06-04 LAB — CBC WITH DIFFERENTIAL/PLATELET
BASOS ABS: 0 10*3/uL (ref 0–0.1)
Basophils Relative: 0 %
EOS PCT: 3 %
Eosinophils Absolute: 0.6 10*3/uL (ref 0–0.7)
HCT: 40.2 % (ref 40.0–52.0)
Hemoglobin: 13.3 g/dL (ref 13.0–18.0)
Lymphocytes Relative: 9 %
Lymphs Abs: 1.8 10*3/uL (ref 1.0–3.6)
MCH: 29.6 pg (ref 26.0–34.0)
MCHC: 33.2 g/dL (ref 32.0–36.0)
MCV: 89.2 fL (ref 80.0–100.0)
MONO ABS: 2.2 10*3/uL — AB (ref 0.2–1.0)
Monocytes Relative: 11 %
NEUTROS PCT: 77 %
Neutro Abs: 15.2 10*3/uL — ABNORMAL HIGH (ref 1.4–6.5)
PLATELETS: 430 10*3/uL (ref 150–440)
RBC: 4.51 MIL/uL (ref 4.40–5.90)
RDW: 14.6 % — ABNORMAL HIGH (ref 11.5–14.5)
WBC: 19.8 10*3/uL — AB (ref 3.8–10.6)

## 2016-06-04 LAB — COMPREHENSIVE METABOLIC PANEL
ALBUMIN: 2.6 g/dL — AB (ref 3.5–5.0)
ALT: 71 U/L — AB (ref 17–63)
AST: 81 U/L — AB (ref 15–41)
Alkaline Phosphatase: 164 U/L — ABNORMAL HIGH (ref 38–126)
Anion gap: 14 (ref 5–15)
BUN: 47 mg/dL — AB (ref 6–20)
CHLORIDE: 102 mmol/L (ref 101–111)
CO2: 19 mmol/L — AB (ref 22–32)
CREATININE: 1.41 mg/dL — AB (ref 0.61–1.24)
Calcium: 9 mg/dL (ref 8.9–10.3)
GFR calc Af Amer: 56 mL/min — ABNORMAL LOW (ref >60)
GFR calc non Af Amer: 48 mL/min — ABNORMAL LOW (ref >60)
GLUCOSE: 109 mg/dL — AB (ref 65–99)
Potassium: 5.1 mmol/L (ref 3.5–5.1)
SODIUM: 135 mmol/L (ref 135–145)
Total Bilirubin: 1.3 mg/dL — ABNORMAL HIGH (ref 0.3–1.2)
Total Protein: 7.1 g/dL (ref 6.5–8.1)

## 2016-06-04 LAB — CULTURE, BODY FLUID-BOTTLE: CULTURE: NO GROWTH

## 2016-06-04 LAB — LACTIC ACID, PLASMA
Lactic Acid, Venous: 2.3 mmol/L (ref 0.5–1.9)
Lactic Acid, Venous: 2.5 mmol/L (ref 0.5–1.9)

## 2016-06-04 LAB — CULTURE, BODY FLUID W GRAM STAIN -BOTTLE

## 2016-06-04 LAB — URIC ACID: Uric Acid, Serum: 9.7 mg/dL — ABNORMAL HIGH (ref 4.4–7.6)

## 2016-06-04 MED ORDER — ESCITALOPRAM OXALATE 10 MG PO TABS
20.0000 mg | ORAL_TABLET | Freq: Every day | ORAL | Status: DC
  Administered 2016-06-05 – 2016-06-07 (×3): 20 mg via ORAL
  Filled 2016-06-04 (×3): qty 2

## 2016-06-04 MED ORDER — ONDANSETRON HCL 4 MG/2ML IJ SOLN: 4.0000 mg | Freq: Four times a day (QID) | INTRAMUSCULAR | Status: DC | PRN

## 2016-06-04 MED ORDER — VANCOMYCIN HCL IN DEXTROSE 1-5 GM/200ML-% IV SOLN
1000.0000 mg | Freq: Once | INTRAVENOUS | Status: AC
  Administered 2016-06-04: 1000 mg via INTRAVENOUS
  Filled 2016-06-04: qty 200

## 2016-06-04 MED ORDER — ALBUTEROL SULFATE (2.5 MG/3ML) 0.083% IN NEBU: 2.5000 mg | INHALATION_SOLUTION | RESPIRATORY_TRACT | Status: DC | PRN

## 2016-06-04 MED ORDER — VANCOMYCIN HCL 10 G IV SOLR
1500.0000 mg | INTRAVENOUS | Status: DC
  Administered 2016-06-05 – 2016-06-06 (×3): 1500 mg via INTRAVENOUS
  Filled 2016-06-04 (×4): qty 1500

## 2016-06-04 MED ORDER — PIPERACILLIN-TAZOBACTAM 3.375 G IVPB
3.3750 g | Freq: Three times a day (TID) | INTRAVENOUS | Status: DC
  Administered 2016-06-04 – 2016-06-07 (×9): 3.375 g via INTRAVENOUS
  Filled 2016-06-04 (×12): qty 50

## 2016-06-04 MED ORDER — POLYETHYLENE GLYCOL 3350 17 G PO PACK
17.0000 g | PACK | Freq: Every day | ORAL | Status: DC | PRN
  Administered 2016-06-04: 17 g via ORAL
  Filled 2016-06-04: qty 1

## 2016-06-04 MED ORDER — DOCUSATE SODIUM 100 MG PO CAPS
100.0000 mg | ORAL_CAPSULE | Freq: Two times a day (BID) | ORAL | Status: DC
  Administered 2016-06-04 – 2016-06-07 (×6): 100 mg via ORAL
  Filled 2016-06-04 (×6): qty 1

## 2016-06-04 MED ORDER — ALLOPURINOL 300 MG PO TABS
300.0000 mg | ORAL_TABLET | Freq: Every day | ORAL | Status: DC
  Administered 2016-06-05 – 2016-06-07 (×3): 300 mg via ORAL
  Filled 2016-06-04 (×3): qty 1

## 2016-06-04 MED ORDER — FENTANYL CITRATE (PF) 100 MCG/2ML IJ SOLN
50.0000 ug | Freq: Once | INTRAMUSCULAR | Status: AC
  Administered 2016-06-04: 50 ug via INTRAVENOUS
  Filled 2016-06-04: qty 2

## 2016-06-04 MED ORDER — SODIUM CHLORIDE 0.9 % IV SOLN
1000.0000 mL | Freq: Once | INTRAVENOUS | Status: AC
  Administered 2016-06-04: 1000 mL via INTRAVENOUS

## 2016-06-04 MED ORDER — BISACODYL 10 MG RE SUPP: 10.0000 mg | Freq: Every day | RECTAL | Status: DC | PRN

## 2016-06-04 MED ORDER — ENOXAPARIN SODIUM 40 MG/0.4ML ~~LOC~~ SOLN
40.0000 mg | SUBCUTANEOUS | Status: DC
  Administered 2016-06-05: 40 mg via SUBCUTANEOUS
  Filled 2016-06-04: qty 0.4

## 2016-06-04 MED ORDER — OXYCODONE HCL 5 MG PO TABS: 5.0000 mg | ORAL_TABLET | ORAL | Status: DC | PRN

## 2016-06-04 MED ORDER — ACETAMINOPHEN 325 MG PO TABS: 650.0000 mg | ORAL_TABLET | Freq: Four times a day (QID) | ORAL | Status: DC | PRN

## 2016-06-04 MED ORDER — ONDANSETRON HCL 4 MG PO TABS: 4.0000 mg | ORAL_TABLET | Freq: Four times a day (QID) | ORAL | Status: DC | PRN

## 2016-06-04 MED ORDER — SODIUM CHLORIDE 0.9 % IV SOLN
INTRAVENOUS | Status: DC
  Administered 2016-06-04 – 2016-06-05 (×2): via INTRAVENOUS

## 2016-06-04 MED ORDER — CEFEPIME HCL 2 G IJ SOLR
2.0000 g | Freq: Once | INTRAMUSCULAR | Status: AC
  Administered 2016-06-04: 2 g via INTRAVENOUS
  Filled 2016-06-04: qty 2

## 2016-06-04 MED ORDER — ACETAMINOPHEN 650 MG RE SUPP: 650.0000 mg | Freq: Four times a day (QID) | RECTAL | Status: DC | PRN
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
## 2016-06-04 NOTE — Progress Notes (Signed)
Pharmacy Antibiotic Note  Henry Lewis is a 75 y.o. male admitted on 06/04/2016 with pneumonia/Empyema.  Pharmacy has been consulted for vancomycin and Zosyn dosing. Patient received one dose of Vancomycin 1g IV and Cefepime 2gm in ED  Plan: DW: 92kg    T1/2: 12.8    Ke:  0.054   Vd: 64  Will start the patient on vancomycin 1500mg  IV every 18 hours with 6 hour stack dosing. Calculated trough at Css is 17.  Will continue to monitor renal function and adjust dose as needed.  Will Start the patient on Zosyn 3.375 IV EI every 8 hours.   Height: 6' (182.9 cm) Weight: 235 lb (106.6 kg) IBW/kg (Calculated) : 77.6  Temp (24hrs), Avg:98.3 F (36.8 C), Min:98.3 F (36.8 C), Max:98.3 F (36.8 C)   Recent Labs Lab 05/29/16 1205 05/29/16 1623 05/29/16 1910 05/30/16 0421 05/31/16 0459 06/01/16 0410 06/04/16 1530 06/04/16 1708  WBC 14.4*  --   --  14.4* 15.5* 16.8* 19.8*  --   CREATININE 0.99  --   --  0.95  --   --  1.41*  --   LATICACIDVEN  --  1.5 1.3  --   --   --   --  2.3*    Estimated Creatinine Clearance: 58.9 mL/min (by C-G formula based on SCr of 1.41 mg/dL).    Allergies  Allergen Reactions  . Codeine Sulfate   . Morphine And Related Other (See Comments)    Higher doses - hallucinates  . Latex Palpitations    Skin irritation with latex bandaids    Antimicrobials this admission: 8/3 Zosyn >>  8/3 Vancomycin >>   Dose adjustments this admission:   Microbiology results: 8/3 BCx: pending 8/3 UCx: pending 8/3 MRSA PCR: pending  Thank you for allowing pharmacy to be a part of this patient's care.  Nancy Fetter, PharmD Clinical Pharmacist 06/04/2016 8:02 PM
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
## 2016-06-04 NOTE — ED Triage Notes (Signed)
Discharged from Hospital on Monday and 1.5 L from right lung.  Discharged home with antibiotic.  Followed up with [place] and had blood drawn yesterday-- LFT and BUN Creatinine elevated.  Patient also c/o no appetite.

## 2016-06-04 NOTE — Progress Notes (Signed)
Anticoagulation monitoring(Lovenox):  75 yo  M ordered Lovenox 30 mg Q24h  Filed Weights   06/04/16 1509  Weight: 235 lb (106.6 kg)   BMI    Lab Results  Component Value Date   CREATININE 1.41 (H) 06/04/2016   CREATININE 0.95 05/30/2016   CREATININE 0.99 05/29/2016   Estimated Creatinine Clearance: 58.9 mL/min (by C-G formula based on SCr of 1.41 mg/dL). Hemoglobin & Hematocrit     Component Value Date/Time   HGB 13.3 06/04/2016 1530   HCT 40.2 06/04/2016 1530     Per Protocol for Patient with estCrcl < 30 ml/min and BMI < 40, will transition to Lovenox 40 mg Q24h.

## 2016-06-04 NOTE — Telephone Encounter (Signed)
Patient currently at physicians assistant office, noted to have elevated creatinine, increased shortness of breath, and elevated white cell count.  Physician assistant stated that patient needs to go to ER, but he will not agree to go unless he  speaks to his pulmonologist, Dr. Glennie Hawk.  Patient with known pleural effusion status post R  thoracentesis, 1.5L removed, seen by Dr. Glennie Hawk during admission.   I advised physician assistant that if she clinically thinks the patient should go to the ER, then that is what is needed. Physician assistant stated that she would relay this message to the patient and send him to the emergency room for further evaluation.    VM

## 2016-06-05 ENCOUNTER — Inpatient Hospital Stay: Payer: Medicare Other

## 2016-06-05 DIAGNOSIS — J90 Pleural effusion, not elsewhere classified: Principal | ICD-10-CM

## 2016-06-05 DIAGNOSIS — E440 Moderate protein-calorie malnutrition: Secondary | ICD-10-CM | POA: Insufficient documentation

## 2016-06-05 LAB — CBC
HCT: 33.8 % — ABNORMAL LOW (ref 40.0–52.0)
Hemoglobin: 11.5 g/dL — ABNORMAL LOW (ref 13.0–18.0)
MCH: 30.4 pg (ref 26.0–34.0)
MCHC: 34 g/dL (ref 32.0–36.0)
MCV: 89.4 fL (ref 80.0–100.0)
PLATELETS: 389 10*3/uL (ref 150–440)
RBC: 3.78 MIL/uL — ABNORMAL LOW (ref 4.40–5.90)
RDW: 14.4 % (ref 11.5–14.5)
WBC: 15.7 10*3/uL — ABNORMAL HIGH (ref 3.8–10.6)

## 2016-06-05 LAB — BASIC METABOLIC PANEL
ANION GAP: 5 (ref 5–15)
BUN: 42 mg/dL — ABNORMAL HIGH (ref 6–20)
CALCIUM: 8.3 mg/dL — AB (ref 8.9–10.3)
CO2: 26 mmol/L (ref 22–32)
Chloride: 107 mmol/L (ref 101–111)
Creatinine, Ser: 1.19 mg/dL (ref 0.61–1.24)
GFR, EST NON AFRICAN AMERICAN: 59 mL/min — AB (ref >60)
Glucose, Bld: 135 mg/dL — ABNORMAL HIGH (ref 65–99)
POTASSIUM: 4.1 mmol/L (ref 3.5–5.1)
SODIUM: 138 mmol/L (ref 135–145)

## 2016-06-05 LAB — MRSA PCR SCREENING: MRSA BY PCR: NEGATIVE

## 2016-06-05 LAB — URINALYSIS COMPLETE WITH MICROSCOPIC (ARMC ONLY)
Bacteria, UA: NONE SEEN
Bilirubin Urine: NEGATIVE
Glucose, UA: NEGATIVE mg/dL
HGB URINE DIPSTICK: NEGATIVE
KETONES UR: NEGATIVE mg/dL
LEUKOCYTES UA: NEGATIVE
NITRITE: NEGATIVE
PH: 5 (ref 5.0–8.0)
PROTEIN: NEGATIVE mg/dL
Specific Gravity, Urine: 1.021 (ref 1.005–1.030)

## 2016-06-05 LAB — LACTIC ACID, PLASMA: Lactic Acid, Venous: 1.9 mmol/L (ref 0.5–1.9)

## 2016-06-05 MED ORDER — FENTANYL CITRATE (PF) 100 MCG/2ML IJ SOLN
INTRAMUSCULAR | Status: AC
  Filled 2016-06-05: qty 2

## 2016-06-05 MED ORDER — FENTANYL CITRATE (PF) 100 MCG/2ML IJ SOLN
INTRAMUSCULAR | Status: AC | PRN
  Administered 2016-06-05 (×2): 50 ug via INTRAVENOUS

## 2016-06-05 MED ORDER — IPRATROPIUM-ALBUTEROL 0.5-2.5 (3) MG/3ML IN SOLN
3.0000 mL | Freq: Four times a day (QID) | RESPIRATORY_TRACT | Status: DC
  Administered 2016-06-05 – 2016-06-07 (×8): 3 mL via RESPIRATORY_TRACT
  Filled 2016-06-05 (×7): qty 3

## 2016-06-05 NOTE — Progress Notes (Signed)
Per MD Pyreddy, pt will need to be seen by the Cardiologist. An order has been entered.

## 2016-06-05 NOTE — Progress Notes (Signed)
Pt's HR keeps dropping to the 20's (non sustaining) and asystole at times on monitor, pt is asleep and asymptomatic when assessed. Pt is now wearing his CPAP and denies any symptoms. Tele box and leads have been changed, will continue to monitor. MD aware.
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
## 2016-06-05 NOTE — Progress Notes (Signed)
Pt has home CPAP. CPAP plugged into red outlet. Pt will place when ready for sleep

## 2016-06-05 NOTE — Progress Notes (Signed)
Initial Nutrition Assessment  DOCUMENTATION CODES:   Non-severe (moderate) malnutrition in context of acute illness/injury  INTERVENTION:  1.  General healthful diet; encourage intake as able.  Patient appetite likely suppressed due to acute illness.  Patient reports slow improvement since admission.  Patient has not undergone thoracentesis which may provide more comfort and ease with meal times.  Will continue to follow.  Declines supplements at this time.  Reinforced goal of weight stablization without further loss.    NUTRITION DIAGNOSIS:   Inadequate oral intake related to poor appetite as evidenced by per patient/family report, percent weight loss.  GOAL:   Patient will meet greater than or equal to 90% of their needs  MONITOR:   PO intake, Supplement acceptance, Weight trends  REASON FOR ASSESSMENT:   Malnutrition Screening Tool    ASSESSMENT:   Patient admitted with recent episodes of emphyema requiring thoracentesis.  Patient is currently undergoing work-up for cardiac origin of his effusion. RD spoke with patient and wife who report 10-15 lbs weight loss over the past 1 month.  Patient reports poor appetite and intake over recent weeks. Wife also requests healthy eating education which is provided.   Nutrition-focused physical exam shows area of mild wasting, particularly over bony prominences.  Patient meets criteria for mild malnutrition based on 7% weight loss over 1 month with reported poor PO intake for at least 7 days.    Patient eating breakfast at time of visit.  RD estimates 25% of meal completed.   Diet Order:  Diet NPO time specified  Skin:  Reviewed, no issues  Last BM:  8/2  Height:   Ht Readings from Last 1 Encounters:  06/04/16 6' (1.829 m)    Weight:   Wt Readings from Last 1 Encounters:  06/05/16 223 lb 3.2 oz (101.2 kg)    Ideal Body Weight:     BMI:  Body mass index is 30.27 kg/m.  Estimated Nutritional Needs:   Kcal:   2320-2500  Protein:  80-100g  Fluid:  ~2.0 L/day  EDUCATION NEEDS:   Education needs addressed  Brynda Greathouse, MS RD LDN Clinical Inpatient Dietitian

## 2016-06-05 NOTE — Procedures (Signed)
Technically successful CT guided placed of a 12 Fr drainage catheter placement into the right pleural space yielding 800 cc of slightly blood tinged pleural fluid.    All aspirated samples sent to the laboratory for analysis.    EBL: None  No immediate post procedural complications.   Ronny Bacon, MD Pager #: [phone]
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
## 2016-06-05 NOTE — Progress Notes (Signed)
[facility] [place] NAME: Henry Lewis    MR#:  [id number]  DATE OF BIRTH:  Jan 27, 1941  SUBJECTIVE:  CHIEF COMPLAINT:   Chief Complaint  Patient presents with  . abnormal labs  . Anorexia  Waiting for procedure, feels hungry, wife at bedside  REVIEW OF SYSTEMS:  Review of Systems  Constitutional: Positive for malaise/fatigue and weight loss. Negative for chills and fever.  HENT: Negative for nosebleeds and sore throat.   Eyes: Negative for blurred vision.  Respiratory: Positive for shortness of breath. Negative for cough and wheezing.   Cardiovascular: Negative for chest pain, orthopnea, leg swelling and PND.  Gastrointestinal: Negative for abdominal pain, constipation, diarrhea, heartburn, nausea and vomiting.  Genitourinary: Negative for dysuria and urgency.  Musculoskeletal: Negative for back pain.  Skin: Negative for rash.  Neurological: Positive for weakness. Negative for dizziness, speech change, focal weakness and headaches.  Endo/Heme/Allergies: Does not bruise/bleed easily.  Psychiatric/Behavioral: Negative for depression.    DRUG ALLERGIES:   Allergies  Allergen Reactions  . Codeine Sulfate   . Morphine And Related Other (See Comments)    Higher doses - hallucinates  . Latex Palpitations    Skin irritation with latex bandaids   VITALS:  Blood pressure 125/71, pulse 95, temperature 97.1 F (36.2 C), resp. rate (!) 21, height 6' (1.829 m), weight 101.2 kg (223 lb 3.2 oz), SpO2 93 %. PHYSICAL EXAMINATION:  Physical Exam  Constitutional: He is oriented to person, place, and time and well-developed, well-nourished, and in no distress.  HENT:  Head: Normocephalic and atraumatic.  Eyes: Conjunctivae and EOM are normal. Pupils are equal, round, and reactive to light.  Neck: Normal range of motion. Neck supple. No tracheal deviation present. No thyromegaly present.  Cardiovascular: Normal rate, regular rhythm and  normal heart sounds.   Pulmonary/Chest: Effort normal. No respiratory distress. He has decreased breath sounds in the right lower field. He has no wheezes. He exhibits no tenderness.  Abdominal: Soft. Bowel sounds are normal. He exhibits no distension. There is no tenderness.  Musculoskeletal: Normal range of motion.  Neurological: He is alert and oriented to person, place, and time. No cranial nerve deficit.  Skin: Skin is warm and dry. No rash noted.  Psychiatric: Mood and affect normal.   LABORATORY PANEL:   CBC  Recent Labs Lab 06/05/16 0034  WBC 15.7*  HGB 11.5*  HCT 33.8*  PLT 389   ------------------------------------------------------------------------------------------------------------------ Chemistries   Recent Labs Lab 06/04/16 1530 06/05/16 0034  NA 135 138  K 5.1 4.1  CL 102 107  CO2 19* 26  GLUCOSE 109* 135*  BUN 47* 42*  CREATININE 1.41* 1.19  CALCIUM 9.0 8.3*  AST 81*  --   ALT 71*  --   ALKPHOS 164*  --   BILITOT 1.3*  --    RADIOLOGY:  Dg Chest 2 View  Result Date: 06/04/2016 CLINICAL DATA:  Discharged from Hospital on Monday and 1.5 L from right lung. Discharged home with antibiotic. Followed up with [facility] and had blood drawn yesterday-- LFT and BUN Creatinine elevated. Patient also c/o no appetite.Hx CKD, renal cancer, HTN EXAM: CHEST  2 VIEW COMPARISON:  06/01/2016 FINDINGS: Since the prior exam, pleural-based opacity in the right mid to lower lung zone has developed. There is also a mild increase in the opacity at the right lung base obscuring the right hemidiaphragm right heart border. Findings reflect combination of partly loculated pleural fluid and lung base parenchymal  opacity, likely atelectasis. Pneumonia is not excluded. There is stable linear opacity at the left lung base consistent with atelectasis. Remainder the left lung is clear. No pneumothorax. Cardiac silhouette is normal in size. Normal mediastinal contours. The inferior right hilum is  obscured by contiguous lung opacity. Left hilum is unremarkable. Minor wedge-shaped compression deformities of several mid thoracic vertebra stable. IMPRESSION: 1. There has been an increase in right pleural fluid, which is partly loculated along the right mid lateral hemi thorax. There has also been increase in opacity at the right lung base, likely atelectasis. Pneumonia as a component should be considered if there are consistent clinical symptoms. 2. No acute findings in the left lung. No other change from the prior study. Electronically Signed   By: Lajean Manes M.D.   On: 06/04/2016 17:17   ASSESSMENT AND PLAN:  75 y.o. male with a known history of Hypertension, history of renal cell carcinoma status post nephrectomy readmitted due to feeling weak and having shortness of breath and that his primary care physician's office was found to have leukocytosis and acute kidney injury.  * Sepsis present on admission - Likely due to underlying lung disease - continue IV antibiotics  * Recurrent right-sided pleural effusion - Worrisome for empyema, fluid consistent with exudative lymphocytic predominance - Continue IV antibiotics. - IR to pigtail Pleural catheter - Appreciate pulmonary evaluation - recommends checking quantiferon gold to rule out tuberculous pleuritis  * Acute renal failure - Likely prerenal - Improving with IV hydration  * Hypertension Hold lisinopril and hydrochlorothiazide.  * DVT prophylaxis Lovenox     All the records are reviewed and case discussed with Care Management/Social Worker. Management plans discussed with the patient, family and they are in agreement.  CODE STATUS: Full code  TOTAL TIME TAKING CARE OF THIS PATIENT: 35 minutes.   More than 50% of the time was spent in counseling/coordination of care: YES  POSSIBLE D/C IN 1-2 DAYS, DEPENDING ON CLINICAL CONDITION.   [facility], Tanya Crothers M.D on 06/05/2016 at 4:01 PM  Between 7am to 6pm - Pager -  [phone]  After 6pm go to [email or web address] - Proofreader  [facility]  Office  [phone]  CC: Primary care physician; Madelyn Brunner, MD  Note: This dictation was prepared with Dragon dictation along with smaller phrase technology. Any transcriptional errors that result from this process are unintentional.

## 2016-06-05 NOTE — Care Management (Signed)
Patient admitted from home with weakness and shortness of breath. Diagnosed with sepsis and empyema.  IV antibiotics and plan for thoracentesis with pigtail  today. He has had a recent inpatient stay in which required thoracentesis x 2 with approximate total  3 liters of fluid removed.   Prior to this illness, Independent in all adls, denies issues accessing medical care, obtaining medications or with transportation.  Current with  PCP.   Current 02 requirement is acute.  Discussed during progression the need to assess for need of home 02 and to maintain mobility.  Patient and his wife are agreeable to home health nursing if indicated at discharge.

## 2016-06-05 NOTE — Progress Notes (Signed)
eLink Physician-Brief Progress Note Patient Name: Henry Lewis DOB: 11-Feb-1941 MRN: [id number]   Date of Service  06/05/2016  HPI/Events of Note  Pigtail (just placed in pleural space) not draining, no respiratory variation  eICU Interventions  Flush with sterile saline CXR     Intervention Category Minor Interventions: Routine modifications to care plan (e.g. PRN medications for pain, fever)  Simonne Maffucci 06/05/2016, 6:09 PM

## 2016-06-05 NOTE — Progress Notes (Signed)
Pharmacy Antibiotic Note  Henry Lewis is a 75 y.o. male admitted on 06/04/2016 with pneumonia/Empyema.  Pharmacy has been consulted for vancomycin and Zosyn dosing. Patient received one dose of Vancomycin 1g IV and Cefepime 2gm in ED  Plan: DW: 92kg    T1/2: 12.8    Ke:  0.054   Vd: 64  Vancomycin 1500mg  IV every 18 hours with 6 hour stack dosing.  Will continue to monitor renal function and adjust dose as needed.  Will Start the patient on Zosyn 3.375 IV EI every 8 hours.   Height: 6' (182.9 cm) Weight: 223 lb 3.2 oz (101.2 kg) IBW/kg (Calculated) : 77.6  Temp (24hrs), Avg:98.3 F (36.8 C), Min:97.1 F (36.2 C), Max:99.2 F (37.3 C)   Recent Labs Lab 05/29/16 1623 05/29/16 1910 05/30/16 0421 05/31/16 0459 06/01/16 0410 06/04/16 1530 06/04/16 1708 06/04/16 2048 06/05/16 0034  WBC  --   --  14.4* 15.5* 16.8* 19.8*  --   --  15.7*  CREATININE  --   --  0.95  --   --  1.41*  --   --  1.19  LATICACIDVEN 1.5 1.3  --   --   --   --  2.3* 2.5* 1.9    Estimated Creatinine Clearance: 68 mL/min (by C-G formula based on SCr of 1.19 mg/dL).    Allergies  Allergen Reactions  . Codeine Sulfate   . Morphine And Related Other (See Comments)    Higher doses - hallucinates  . Latex Palpitations    Skin irritation with latex bandaids    Antimicrobials this admission: 8/3 Zosyn >>  8/3 Vancomycin >>   Dose adjustments this admission:   Microbiology results: 8/3 BCx: pending 8/3 UCx: pending 8/3 MRSA PCR: negative  Thank you for allowing pharmacy to be a part of this patient's care.  Rexene Edison, PharmD Clinical Pharmacist 06/05/2016 3:36 PM

## 2016-06-05 NOTE — Progress Notes (Signed)
Pigtail catheter has been flushed with 30cc of sterile normal saline. Patient tolerated well. Was able to get one clot out of the tubing. Catheter is still not draining after flush. Awaiting CXR at this time. Patient is still asymptomatic.

## 2016-06-05 NOTE — Progress Notes (Addendum)
MD Pyreddy aware that pt's HR drops to the 20-30's and asystole at times, low HR does not sustain. Pt asleep and asymptomatic. Made MD aware that pt has a history of sleep apnea and uses a CPAP at night but refuses to use the hospital's, pt states that he will bring his CPAP from home. Will continue to monitor. No new orders received.

## 2016-06-05 NOTE — Sedation Documentation (Signed)
Vaseline guaze applied to chest tube site covered with two large tegaderm dressings. Pt reports no pain.

## 2016-06-05 NOTE — Progress Notes (Addendum)
Since patient has returned from specials, there has been no drainage from pigtail catheter. Dr. Manuella Ghazi paged and MD got in touch with pulmonology and stated catheter may need to be flushed or tPA administered. Two CCU RN's came to examine drainage system and found no issues with assembly. Pulmonology paged and Dr. Lake Bells from [facility] answered page for Dr. Alva Garnet. MD stated to flush pleurex catheter with 30cc of sterile saline and MD will order chest xray to make sure there is no build up of fluid. Patient is in no acute distress and states he feels better than he did before since 800cc was drained immediately after placement. Lab called and stated that the samples that were received are an "orange jelly substance" and they will not be able to perform some of the tests on it because it is too thick to run through their machine. Dr. Manuella Ghazi is aware and MD acknowledged.

## 2016-06-05 NOTE — Care Management Important Message (Signed)
Important Message  Patient Details  Name: AQEEL RELYEA MRN: [id number] Date of Birth: 03/08/1941   Medicare Important Message Given:  Yes    Katrina Stack, RN 06/05/2016, 11:56 AM
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
## 2016-06-05 NOTE — Progress Notes (Signed)
Notified Dr. Manuella Ghazi on rounding that patient's BP is 104/44. No new orders at this time. Patient states he felt dizzy standing earlier this morning, but feels better now. Fluids at 75 continued.

## 2016-06-06 ENCOUNTER — Inpatient Hospital Stay: Payer: Medicare Other

## 2016-06-06 DIAGNOSIS — J869 Pyothorax without fistula: Secondary | ICD-10-CM

## 2016-06-06 LAB — CBC
HEMATOCRIT: 35.6 % — AB (ref 40.0–52.0)
HEMOGLOBIN: 11.6 g/dL — AB (ref 13.0–18.0)
MCH: 29.8 pg (ref 26.0–34.0)
MCHC: 32.5 g/dL (ref 32.0–36.0)
MCV: 91.8 fL (ref 80.0–100.0)
Platelets: 386 10*3/uL (ref 150–440)
RBC: 3.88 MIL/uL — AB (ref 4.40–5.90)
RDW: 14.3 % (ref 11.5–14.5)
WBC: 17.2 10*3/uL — AB (ref 3.8–10.6)

## 2016-06-06 LAB — BASIC METABOLIC PANEL
ANION GAP: 7 (ref 5–15)
BUN: 26 mg/dL — ABNORMAL HIGH (ref 6–20)
CALCIUM: 8.4 mg/dL — AB (ref 8.9–10.3)
CO2: 24 mmol/L (ref 22–32)
Chloride: 109 mmol/L (ref 101–111)
Creatinine, Ser: 1.08 mg/dL (ref 0.61–1.24)
Glucose, Bld: 120 mg/dL — ABNORMAL HIGH (ref 65–99)
POTASSIUM: 4.6 mmol/L (ref 3.5–5.1)
Sodium: 140 mmol/L (ref 135–145)

## 2016-06-06 LAB — URINE CULTURE: Culture: NO GROWTH

## 2016-06-06 MED ORDER — SODIUM CHLORIDE 0.9 % IJ SOLN
10.0000 mg | Freq: Two times a day (BID) | INTRAMUSCULAR | Status: DC
  Filled 2016-06-06 (×5): qty 10

## 2016-06-06 MED ORDER — STERILE WATER FOR INJECTION IJ SOLN
5.0000 mg | Freq: Two times a day (BID) | RESPIRATORY_TRACT | Status: DC
  Administered 2016-06-07: 5 mg via INTRAPLEURAL
  Filled 2016-06-06 (×5): qty 5

## 2016-06-06 NOTE — Progress Notes (Signed)
Dr. Ether Griffins paged and transferred into room to speak with patient's wife. Questions answered by hospitalist.

## 2016-06-06 NOTE — Progress Notes (Signed)
Dr. Alva Garnet paged to notify that medications have been received for chest tube. MD stated he would be over in about an hour to administer medications. Chest tube did drain about 250cc last night and patient is resting comfortably this morning.

## 2016-06-06 NOTE — Progress Notes (Signed)
[facility] [place] NAME: Henry Lewis    MR#:  [id number]  DATE OF BIRTH:  10-Nov-1940  SUBJECTIVE:  CHIEF COMPLAINT:   Chief Complaint  Patient presents with  . abnormal labs  . Anorexia  Patient is a 74 year old Caucasian male with past history significant for history of essential hypertension, renal cell carcinoma, status post nephrectomy, who presents to the hospital with complaints of weakness, shortness of breath, acute renal failure. Apparently he was in the hospital twice for thoracentesis of 2-1/2 L of fluid, fluid cultures have been negative. Cytology was also negative. He was discharged to the hospital on Levaquin, which she was taking faithfully. On arrival to the hospital, however, he was noted to have leukocytosis, elevated transaminases. His chest x-ray revealed increasing right pleural fluid, partially loculated along the right mid lateral and he might thorax, increase in opacity of right lung base, likely atelectasis. Patient was admitted to the hospital for further evaluation and treatment. He underwent CT-guided placement of percutaneous catheter into the right chest after initial aspirating. 800 cc of blood tinged pleural fluid. Unfortunately, the fluid was very thick and pleural catheter is not draining well despite attempted TPA administration. Chest tube placement was recommended by pulmonary. Patient denies any significant shortness of breath at present. He remains afebrile, not hypoxic, on the room air. Admits of some discomfort in the right chest  REVIEW OF SYSTEMS:  Review of Systems  Constitutional: Positive for malaise/fatigue and weight loss. Negative for chills and fever.  HENT: Negative for nosebleeds and sore throat.   Eyes: Negative for blurred vision.  Respiratory: Positive for shortness of breath. Negative for cough and wheezing.   Cardiovascular: Negative for chest pain, orthopnea, leg swelling and PND.    Gastrointestinal: Negative for abdominal pain, constipation, diarrhea, heartburn, nausea and vomiting.  Genitourinary: Negative for dysuria and urgency.  Musculoskeletal: Negative for back pain.  Skin: Negative for rash.  Neurological: Positive for weakness. Negative for dizziness, speech change, focal weakness and headaches.  Endo/Heme/Allergies: Does not bruise/bleed easily.  Psychiatric/Behavioral: Negative for depression.    DRUG ALLERGIES:   Allergies  Allergen Reactions  . Codeine Sulfate   . Morphine And Related Other (See Comments)    Higher doses - hallucinates  . Latex Palpitations    Skin irritation with latex bandaids   VITALS:  Blood pressure (!) 105/47, pulse 95, temperature 98.6 F (37 C), temperature source Oral, resp. rate 20, height 6' (1.829 m), weight 101.1 kg (222 lb 12.8 oz), SpO2 91 %. PHYSICAL EXAMINATION:  Physical Exam  Constitutional: He is oriented to person, place, and time and well-developed, well-nourished, and in no distress.  HENT:  Head: Normocephalic and atraumatic.  Eyes: Conjunctivae and EOM are normal. Pupils are equal, round, and reactive to light.  Neck: Normal range of motion. Neck supple. No tracheal deviation present. No thyromegaly present.  Cardiovascular: Normal rate, regular rhythm and normal heart sounds.   Pulmonary/Chest: Effort normal. No respiratory distress. He has decreased breath sounds in the right lower field. He has no wheezes. He exhibits no tenderness.  Abdominal: Soft. Bowel sounds are normal. He exhibits no distension. There is no tenderness.  Musculoskeletal: Normal range of motion.  Neurological: He is alert and oriented to person, place, and time. No cranial nerve deficit.  Skin: Skin is warm and dry. No rash noted.  Psychiatric: Mood and affect normal.   LABORATORY PANEL:   CBC  Recent Labs Lab 06/06/16 [phone]  WBC 17.2*  HGB 11.6*  HCT 35.6*  PLT 386    ------------------------------------------------------------------------------------------------------------------ Chemistries   Recent Labs Lab 06/04/16 1530  06/06/16 0608  NA 135  < > 140  K 5.1  < > 4.6  CL 102  < > 109  CO2 19*  < > 24  GLUCOSE 109*  < > 120*  BUN 47*  < > 26*  CREATININE 1.41*  < > 1.08  CALCIUM 9.0  < > 8.4*  AST 81*  --   --   ALT 71*  --   --   ALKPHOS 164*  --   --   BILITOT 1.3*  --   --   < > = values in this interval not displayed. RADIOLOGY:  Dg Chest Port 1 View  Result Date: 06/06/2016 CLINICAL DATA:  Respiratory failure, pleural effusion, f/o right chest tube EXAM: PORTABLE CHEST 1 VIEW COMPARISON:  06/05/2016 FINDINGS: Normal cardiac silhouette. Large RIGHT effusion unchanged. RIGHT chest tube in place. LEFT lung is clear. IMPRESSION: No interval change. Large RIGHT effusion with chest tube in place. Electronically Signed   By: Suzy Bouchard M.D.   On: 06/06/2016 08:12   Dg Chest Port 1 View  Result Date: 06/05/2016 CLINICAL DATA:  Patient with right chest tube which is now not draining. EXAM: PORTABLE CHEST 1 VIEW COMPARISON:  Chest radiograph Mar 04, 2016 FINDINGS: Monitoring leads overlie the patient. Stable cardiac and mediastinal contours. Low lung volumes. Interval increase in moderate to large right pleural effusion with underlying pulmonary consolidation. Minimal heterogeneous opacities left lung base. There is a right-sided chest tube along the lateral right hemi thorax. No pneumothorax. IMPRESSION: Right-sided chest tube along the lateral right lower hemi thorax. Interval increase in size of now moderate to large right pleural effusion with underlying pulmonary consolidation. Probable left basilar atelectasis. Electronically Signed   By: Lovey Newcomer M.D.   On: 06/05/2016 19:18   Ct Image Guided Drainage By Percutaneous Catheter  Result Date: 06/05/2016 INDICATION: Recurrent symptomatic right-sided pleural effusion post 2 previous  ultrasound-guided thoracentesis. Request made now for placement of a CT-guided right chest tube. EXAM: CT IMAGE GUIDED DRAINAGE BY PERCUTANEOUS CATHETER COMPARISON:  None. MEDICATIONS: The patient is currently admitted to the hospital and receiving intravenous antibiotics. The antibiotics were administered within an appropriate time frame prior to the initiation of the procedure. ANESTHESIA/SEDATION: Moderate (conscious) sedation was employed during this procedure. A total of Fentanyl 100 mcg was administered intravenously. Moderate Sedation Time: 15 minutes. The patient's level of consciousness and vital signs were monitored continuously by radiology nursing throughout the procedure under my direct supervision. CONTRAST:  None COMPLICATIONS: None immediate. PROCEDURE: Informed written consent was obtained from the patient after a discussion of the risks, benefits and alternatives to treatment. The patient was placed supine on the CT gantry and a pre procedural CT was performed re-demonstrating the recurrent moderate to large size right-sided pleural effusion. The procedure was planned. A timeout was performed prior to the initiation of the procedure. The skin overlying the right inferior intra lateral chest was prepped and draped in the usual sterile fashion. The overlying soft tissues were anesthetized with 1% lidocaine with epinephrine. Appropriate trajectory was planned with the use of a 22 gauge spinal needle. An 18 gauge trocar needle was advanced into the right pleural space and a short Amplatz super stiff wire was coiled within the collection. Appropriate positioning was confirmed with a limited CT scan. The tract was serially dilated allowing placement of a  12 [place] all-purpose drainage catheter. Appropriate positioning was confirmed with a limited postprocedural CT scan. Following chest tube placement, approximately 800 cc of slightly blood tinged pleural fluid was aspirated. The tube was connected to a  pleural vac and sutured in place. A dressing was placed. The patient tolerated the procedure well without immediate post procedural complication. IMPRESSION: Successful CT guided placement of a 55 French all purpose drain catheter into the right pleural space with aspiration of 800 cc of slightly blood tinged pleural fluid. Samples were sent to the laboratory as requested by the ordering clinical team. Electronically Signed   By: Sandi Mariscal M.D.   On: 06/05/2016 17:45   ASSESSMENT AND PLAN:  75 y.o. male with a known history of Hypertension, history of renal cell carcinoma status post nephrectomy readmitted due to feeling weak and having shortness of breath and that his primary care physician's office was found to have leukocytosis and acute kidney injury.  * Sepsis present on admission - Likely due to underlying Empyema- continue IV vancomycin and Zosyn. Blood cultures are negative 2 days, pleural fluid culture is pending. Gram stain was canceled due to pleural fluid being very thick, could not perform. The MRSA PCR was negative.   * Recurrent right-sided pleural effusion - Worrisome for empyema, fluid consistent with exudative lymphocytic predominance - Continue IV antibiotics. - IR to pigtail Pleural catheter may need to be changed to chest tube, surgery consultation is obtained, patient's family is to decide if they want to stay in the hospital versus transfer to tertiary care center - Appreciate pulmonary evaluation - recommends checking quantiferon gold to rule out tuberculous pleuritis, pending. Dr. Faith Rogue to see patient in consultation on Monday and decide about surgical intervention  * Acute renal insufficiency - Likely prerenal - Improving with IV hydration  * Hypotension Hold lisinopril and hydrochlorothiazide.  * DVT prophylaxis Lovenox     All the records are reviewed and case discussed with Care Management/Social Worker. Management plans discussed with the patient,  family and they are in agreement.  CODE STATUS: Full code  TOTAL TIME TAKING CARE OF THIS PATIENT: 35 minutes.  Discussed with patient's wife and nursing staff More than 50% of the time was spent in counseling/coordination of care: YES  POSSIBLE D/C IN 1-2 DAYS, DEPENDING ON CLINICAL CONDITION.   Theodoro Grist M.D on 06/06/2016 at 1:33 PM  Between 7am to 6pm - Pager - [phone]  After 6pm go to [email or web address] - Proofreader  [facility]  Office  [phone]  CC: Primary care physician; Madelyn Brunner, MD  Note: This dictation was prepared with Dragon dictation along with smaller phrase technology. Any transcriptional errors that result from this process are unintentional.

## 2016-06-06 NOTE — Progress Notes (Signed)
Patient has questions about what the plan is as far as his chest tube or possible surgery. Patient's wife is on her way back to the hospital from home and they both would like to be updated and they are thinking about requesting a transfer. Called Dr. Adonis Huguenin and MD stated he still needs to get in touch with Dr. Alva Garnet. MD is aware that patient and family would like to speak with a physician.
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
## 2016-06-06 NOTE — Progress Notes (Signed)
Pt's chest tube continues to drain serous/serosanguineouis drainage. 192cc at this time total output.  Pt. Wore his home C-Pap throughout the night. No signs or c/o pain. No acute distress, pt ran NS on tele throughout the night.

## 2016-06-06 NOTE — Progress Notes (Signed)
[facility] Encounter Note  Patient: Henry Lewis / Admit Date: 06/04/2016 / Date of Encounter: 06/06/2016, 6:33 AM   Subjective: Patient breathing much better after chest tube placement and resolution of significant right pleural effusion. No rhythm disturbances chest pain or congestive heart failure type symptoms  Review of Systems: Positive for: Chest discomfort from chest 2 shortness of breath Negative for: Vision change, hearing change, syncope, dizziness, nausea, vomiting,diarrhea, bloody stool, stomach pain, cough, congestion, diaphoresis, urinary frequency, urinary pain,skin lesions, skin rashes Others previously listed  Objective: Telemetry: Normal sinus rhythm Physical Exam: Blood pressure 116/65, pulse 89, temperature 98.6 F (37 C), temperature source Oral, resp. rate 20, height 6' (1.829 m), weight 223 lb 3.2 oz (101.2 kg), SpO2 90 %. Body mass index is 30.27 kg/m. General: Well developed, well nourished, in no acute distress. Head: Normocephalic, atraumatic, sclera non-icteric, no xanthomas, nares are without discharge. Neck: No apparent masses Lungs: Normal respirations with no wheezes, some rhonchi, few rales , some crackles   Heart: Regular rate and rhythm, normal S1 S2, no murmur, no rub, no gallop, PMI is normal size and placement, carotid upstroke normal without bruit, jugular venous pressure normal Abdomen: Soft, non-tender, non-distended with normoactive bowel sounds. No hepatosplenomegaly. Abdominal aorta is normal size without bruit Extremities: Trace edema, no clubbing, no cyanosis, no ulcers,  Peripheral: 2+ radial, 2+ femoral, 2+ dorsal pedal pulses Neuro: Alert and oriented. Moves all extremities spontaneously. Psych:  Responds to questions appropriately with a normal affect.   Intake/Output Summary (Last 24 hours) at 06/06/16 [id number] Last data filed at 06/06/16 0500  Gross per 24 hour  Intake               50 ml  Output              1422 ml  Net            -1372 ml    Inpatient Medications:  . allopurinol  300 mg Oral Daily  . docusate sodium  100 mg Oral BID  . escitalopram  20 mg Oral Daily  . ipratropium-albuterol  3 mL Nebulization Q6H  . piperacillin-tazobactam (ZOSYN)  IV  3.375 g Intravenous Q8H  . vancomycin  1,500 mg Intravenous Q18H   Infusions:    Labs:  Recent Labs  06/04/16 1530 06/05/16 0034  NA 135 138  K 5.1 4.1  CL 102 107  CO2 19* 26  GLUCOSE 109* 135*  BUN 47* 42*  CREATININE 1.41* 1.19  CALCIUM 9.0 8.3*    Recent Labs  06/04/16 1530  AST 81*  ALT 71*  ALKPHOS 164*  BILITOT 1.3*  PROT 7.1  ALBUMIN 2.6*    Recent Labs  06/04/16 1530 06/05/16 0034  WBC 19.8* 15.7*  NEUTROABS 15.2*  --   HGB 13.3 11.5*  HCT 40.2 33.8*  MCV 89.2 89.4  PLT 430 389   No results for input(s): CKTOTAL, CKMB, TROPONINI in the last 72 hours. Invalid input(s): POCBNP No results for input(s): HGBA1C in the last 72 hours.   Weights: Filed Weights   06/04/16 1509 06/04/16 2113 06/05/16 0450  Weight: 235 lb (106.6 kg) 223 lb 3.2 oz (101.2 kg) 223 lb 3.2 oz (101.2 kg)     Radiology/Studies:  Dg Chest 1 View  Result Date: 06/01/2016 CLINICAL DATA:  Status post right thoracentesis EXAM: CHEST 1 VIEW COMPARISON:  05/31/2016 FINDINGS: Cardiac shadow is stable. The left lung remains clear. The right lung demonstrates some basilar atelectasis/consolidation similar to  that seen on recent CT examination. Small residual pleural effusion is noted likely related to the known loculations. No pneumothorax is seen. IMPRESSION: No pneumothorax following thoracentesis. Electronically Signed   By: Inez Catalina M.D.   On: 06/01/2016 09:44  Dg Chest 1 View  Result Date: 05/30/2016 CLINICAL DATA:  Status post right thoracentesis. EXAM: CHEST 1 VIEW COMPARISON:  05/29/2016 FINDINGS: Right pleural effusion has decreased. There is no evidence of pneumothorax. Right lower lung atelectasis again noted.  Cardiomediastinal silhouette is unchanged. No other changes identified. IMPRESSION: Decreased right pleural effusion status post right thoracentesis. No evidence of pneumothorax. Continued right lower lung atelectasis. Electronically Signed   By: Margarette [place] M.D.   On: 05/30/2016 15:32  Dg Chest 2 View  Result Date: 06/04/2016 CLINICAL DATA:  Discharged from Hospital on Monday and 1.5 L from right lung. Discharged home with antibiotic. Followed up with [facility] and had blood drawn yesterday-- LFT and BUN Creatinine elevated. Patient also c/o no appetite.Hx CKD, renal cancer, HTN EXAM: CHEST  2 VIEW COMPARISON:  06/01/2016 FINDINGS: Since the prior exam, pleural-based opacity in the right mid to lower lung zone has developed. There is also a mild increase in the opacity at the right lung base obscuring the right hemidiaphragm right heart border. Findings reflect combination of partly loculated pleural fluid and lung base parenchymal opacity, likely atelectasis. Pneumonia is not excluded. There is stable linear opacity at the left lung base consistent with atelectasis. Remainder the left lung is clear. No pneumothorax. Cardiac silhouette is normal in size. Normal mediastinal contours. The inferior right hilum is obscured by contiguous lung opacity. Left hilum is unremarkable. Minor wedge-shaped compression deformities of several mid thoracic vertebra stable. IMPRESSION: 1. There has been an increase in right pleural fluid, which is partly loculated along the right mid lateral hemi thorax. There has also been increase in opacity at the right lung base, likely atelectasis. Pneumonia as a component should be considered if there are consistent clinical symptoms. 2. No acute findings in the left lung. No other change from the prior study. Electronically Signed   By: Lajean Manes M.D.   On: 06/04/2016 17:17   Dg Chest 2 View  Result Date: 05/29/2016 CLINICAL DATA:  Pneumonia and shortness of breath. EXAM: CHEST  2 VIEW  COMPARISON:  04/03/2009 FINDINGS: Cardiomediastinal silhouette is normal. Mediastinal contours appear intact. There is no evidence of pneumothorax. There is minimal left lung base atelectasis. There is a large right pleural effusion with right lower lobe atelectasis versus airspace consolidation. Osseous structures are without acute abnormality. Soft tissues are grossly normal. IMPRESSION: Large right pleural effusion with right lower lobe airspace consolidation versus atelectasis. Minimal atelectatic changes of the left lung base. Electronically Signed   By: Fidela Salisbury M.D.   On: 05/29/2016 13:22  Ct Chest Wo Contrast  Result Date: 05/29/2016 CLINICAL DATA:  75 year old male with shortness of breath and hypoxia. Patient on second round of antibiotics for recently diagnosed pneumonia. EXAM: CT CHEST WITHOUT CONTRAST TECHNIQUE: Multidetector CT imaging of the chest was performed following the standard protocol without IV contrast. COMPARISON:  05/29/2016 and prior radiographs. 04/07/2013 abdominal and pelvic CT FINDINGS: Cardiovascular: Heart size is within normal limits. Coronary artery calcifications are identified. Thoracic aortic atherosclerotic calcifications are present without evidence of aneurysm. Mediastinum/Nodes: Upper limits of normal sized mediastinal lymph nodes are identified. There is mild mediastinal shift to the left. Lungs/Pleura: A large right pleural effusion is noted with moderate to severe right lung atelectasis. No  definite pleural masses are identified on this noncontrast study. Mild left basilar atelectasis is present. There is no evidence of pneumothorax or discrete mass. Upper Abdomen: No acute abnormality. Left renal cysts and angiomyolipoma again noted. Abdominal aortic atherosclerotic calcifications noted. Musculoskeletal: No acute or suspicious abnormalities identified. IMPRESSION: Large right pleural effusion with moderate to severe right lung atelectasis. No definite  pleural abnormality identified on this noncontrast study. Coronary artery disease and thoracic aortic atherosclerosis. Abdominal aortic atherosclerosis. Electronically Signed   By: Margarette [place] M.D.   On: 05/29/2016 16:01  Dg Chest Port 1 View  Result Date: 06/05/2016 CLINICAL DATA:  Patient with right chest tube which is now not draining. EXAM: PORTABLE CHEST 1 VIEW COMPARISON:  Chest radiograph Mar 04, 2016 FINDINGS: Monitoring leads overlie the patient. Stable cardiac and mediastinal contours. Low lung volumes. Interval increase in moderate to large right pleural effusion with underlying pulmonary consolidation. Minimal heterogeneous opacities left lung base. There is a right-sided chest tube along the lateral right hemi thorax. No pneumothorax. IMPRESSION: Right-sided chest tube along the lateral right lower hemi thorax. Interval increase in size of now moderate to large right pleural effusion with underlying pulmonary consolidation. Probable left basilar atelectasis. Electronically Signed   By: Lovey Newcomer M.D.   On: 06/05/2016 19:18   Dg Chest Port 1 View  Result Date: 05/31/2016 CLINICAL DATA:  Acute respiratory failure. EXAM: PORTABLE CHEST 1 VIEW COMPARISON:  05/30/2016 and prior exams FINDINGS: A right pleural effusion and right lower lung atelectasis again noted Mild left basilar atelectasis again identified. There is no evidence of pneumothorax. The cardiomediastinal silhouette is unchanged. IMPRESSION: Unchanged appearance of the chest with right pleural effusion and and right lower lung atelectasis. Electronically Signed   By: Margarette [place] M.D.   On: 05/31/2016 07:24  Ct Image Guided Drainage By Percutaneous Catheter  Result Date: 06/05/2016 INDICATION: Recurrent symptomatic right-sided pleural effusion post 2 previous ultrasound-guided thoracentesis. Request made now for placement of a CT-guided right chest tube. EXAM: CT IMAGE GUIDED DRAINAGE BY PERCUTANEOUS CATHETER COMPARISON:  None.  MEDICATIONS: The patient is currently admitted to the hospital and receiving intravenous antibiotics. The antibiotics were administered within an appropriate time frame prior to the initiation of the procedure. ANESTHESIA/SEDATION: Moderate (conscious) sedation was employed during this procedure. A total of Fentanyl 100 mcg was administered intravenously. Moderate Sedation Time: 15 minutes. The patient's level of consciousness and vital signs were monitored continuously by radiology nursing throughout the procedure under my direct supervision. CONTRAST:  None COMPLICATIONS: None immediate. PROCEDURE: Informed written consent was obtained from the patient after a discussion of the risks, benefits and alternatives to treatment. The patient was placed supine on the CT gantry and a pre procedural CT was performed re-demonstrating the recurrent moderate to large size right-sided pleural effusion. The procedure was planned. A timeout was performed prior to the initiation of the procedure. The skin overlying the right inferior intra lateral chest was prepped and draped in the usual sterile fashion. The overlying soft tissues were anesthetized with 1% lidocaine with epinephrine. Appropriate trajectory was planned with the use of a 22 gauge spinal needle. An 18 gauge trocar needle was advanced into the right pleural space and a short Amplatz super stiff wire was coiled within the collection. Appropriate positioning was confirmed with a limited CT scan. The tract was serially dilated allowing placement of a 12 [place] all-purpose drainage catheter. Appropriate positioning was confirmed with a limited postprocedural CT scan. Following chest tube placement, approximately 800 cc  of slightly blood tinged pleural fluid was aspirated. The tube was connected to a pleural vac and sutured in place. A dressing was placed. The patient tolerated the procedure well without immediate post procedural complication. IMPRESSION: Successful CT  guided placement of a 30 French all purpose drain catheter into the right pleural space with aspiration of 800 cc of slightly blood tinged pleural fluid. Samples were sent to the laboratory as requested by the ordering clinical team. Electronically Signed   By: Sandi Mariscal M.D.   On: 06/05/2016 17:45   US Thoracentesis Asp Pleural Space W/img Guide  Result Date: 06/01/2016 INDICATION: Recurrent right-sided pleural effusion EXAM: ULTRASOUND GUIDED THORACENTESIS MEDICATIONS: None. ANESTHESIA/SEDATION: None COMPLICATIONS: None immediate. PROCEDURE: An ultrasound guided thoracentesis was thoroughly discussed with the patient and questions answered. The benefits, risks, alternatives and complications were also discussed. The patient understands and wishes to proceed with the procedure. Written consent was obtained. Ultrasound was performed to localize and mark an adequate pocket of fluid in the right chest. The area was then prepped and draped in the normal sterile fashion. 1% Lidocaine was used for local anesthesia. Under ultrasound guidance a 6 French thoracentesis catheter was introduced. Thoracentesis was performed. The catheter was removed and a dressing applied. FINDINGS: A total of approximately 1.5 L of amber colored fluid was removed. A fluid sample was sent for laboratory analysis. IMPRESSION: Successful ultrasound guided right thoracentesis yielding 1.5 L of pleural fluid. It should be noted that the fluid appeared somewhat loculated on today's examination. If the pleural effusion recurs and increasing loculations are noted, a more aggressive large bore catheter therapy may be necessary. Electronically Signed   By: Inez Catalina M.D.   On: 06/01/2016 09:43  US Thoracentesis Asp Pleural Space W/img Guide  Result Date: 05/30/2016 INDICATION: Respiratory distress and large right pleural effusion. EXAM: ULTRASOUND GUIDED RIGHT THORACENTESIS MEDICATIONS: None. COMPLICATIONS: None immediate. PROCEDURE: An  ultrasound guided thoracentesis was thoroughly discussed with the patient and questions answered. The benefits, risks, alternatives and complications were also discussed. The patient understands and wishes to proceed with the procedure. Written consent was obtained. Ultrasound was performed to localize and mark an adequate pocket of fluid in the right posterior chest. The area was then prepped and draped in the normal sterile fashion. 1% Lidocaine was used for local anesthesia. Under ultrasound guidance a 6 Fr Safe-T-Centesis catheter was introduced. Thoracentesis was performed. The catheter was removed and a dressing applied. FINDINGS: A total of approximately 2 L of amber colored fluid was removed. Samples were sent to the laboratory as requested by the clinical team. IMPRESSION: Successful ultrasound guided right thoracentesis yielding 2 L of pleural fluid. Electronically Signed   By: Markus Daft M.D.   On: 05/30/2016 15:47    Assessment and Recommendation  75 y.o. male with episode of the syncope of unknown etiology possibly secondary to hypoxia and current condition with infection as well as significant pleural effusion and episode of bradycardia currently resolved with no evidence of rhythm disturbances by telemetry. The patient has had improvements of symptoms with the chest tube placement 1. Patient may be transferred to the floor and discontinuation of telemetry if no further rhythm disturbances seen 2. No need for additional medication management for blood pressure currently stable 3. Continue supportive care for recurrent pleural effusion which appears to be inflammatory and/or from infection rather than acute coronary syndrome and/or congestive heart failure 4. No further cardiac diagnostics necessary at this time  Signed, Serafina Royals M.D. FACC

## 2016-06-06 NOTE — Progress Notes (Signed)
Pt. And wife were concerned about chest tube draining properly. I explained to them the tube is draining and the doctors are aware of chest x-rays and any lab results posted. I also reassured pt and wife he is stable at this time and if there was a concern the MD would be notified immediatly. The wife asked if it would be possible for Henry Lewis to be transferred to another hospital. I explained we would have to to talk to the doctor but at this time Henry Lewis is stable and there is no need to transfer. She stated she had spoke with her son-in-law who was an EMT and he believes Henry Lewis needs to be transferred to [facility] for further evaluation. She stated he would be her this evening and they would discuss it further.

## 2016-06-06 NOTE — Progress Notes (Signed)
Dr. Alva Garnet attempted to administer tPA into pigtail catheter and the catheter is clogged off. Unable to give meds and MD is placing a thoracic surgery consult. Secretary updated and contacted general surgery to come and see the patient. Patient stated he may want to go to [facility] if he is going to need surgery, but will discuss more with the doctors here and his family.

## 2016-06-06 NOTE — Progress Notes (Signed)
Explained to family that Dr. Baruch Merl would inform the day doctors of transfer request in the A.M. Explained the transfer process of how the MD would have to call [facility] and make the request and the [facility] MD would have to accept the request and a bed would have to available prior to transfer. Family and pt. Both stated they understood and seemed happy with this information.

## 2016-06-06 NOTE — Progress Notes (Signed)
Pt. Resting peacefully in bed at this time with wife at bedside. C-pap in place, chest tube in place and draining serous/serosanguineous drainage. Will continue to monitor pt.

## 2016-06-06 NOTE — Progress Notes (Signed)
Alteplase cathfloactivase  And dornaseaplha put on hold, until further notice from MD, per pharmacy Dr. Alva Garnet will re-attempt again on Sunday or Monday. Pharmacy is to be notified prior to procedure so they can prepare medication.

## 2016-06-06 NOTE — Progress Notes (Signed)
Pigtail catheter placed 08/04. Initially drained 800 cc but none since then. The specimen that was sent to the lab could not be run as it had coagulated rapidly. No new complaints. No distress  Vitals:   06/05/16 2102 06/06/16 0535 06/06/16 0654 06/06/16 1142  BP:  116/65  (!) 105/47  Pulse:  89  95  Resp:  20  20  Temp:  98.6 F (37 C)  98.6 F (37 C)  TempSrc:  Oral  Oral  SpO2: 92% 90%  91%  Weight:   222 lb 12.8 oz (101.1 kg)   Height:       NAD HEENT WNL No JVD Diminished BS in R base, no wheezes Reg, no M NABS, soft No C/C/E  BMP Latest Ref Rng & Units 06/06/2016 06/05/2016 06/04/2016  Glucose 65 - 99 mg/dL 120(H) 135(H) 109(H)  BUN 6 - 20 mg/dL 26(H) 42(H) 47(H)  Creatinine 0.61 - 1.24 mg/dL 1.08 1.19 1.41(H)  Sodium 135 - 145 mmol/L 140 138 135  Potassium 3.5 - 5.1 mmol/L 4.6 4.1 5.1  Chloride 101 - 111 mmol/L 109 107 102  CO2 22 - 32 mmol/L 24 26 19(L)  Calcium 8.9 - 10.3 mg/dL 8.4(L) 8.3(L) 9.0   CBC Latest Ref Rng & Units 06/06/2016 06/05/2016 06/04/2016  WBC 3.8 - 10.6 K/uL 17.2(H) 15.7(H) 19.8(H)  Hemoglobin 13.0 - 18.0 g/dL 11.6(L) 11.5(L) 13.3  Hematocrit 40.0 - 52.0 % 35.6(L) 33.8(L) 40.2  Platelets 150 - 440 K/uL 386 389 430   CXR: NSC pleural effusion  IMPRESSION:   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 Now with nonfunctional pigtail catheter. I tried to flush and was unsuccessful and intended to instill tPA and alpha dornase which cannot be instilled. The catheter is fully obstructed. He will likely need VATS to fully evacuate his pleural space  PLAN/REC: I will try to reach IR to see if they have any other tricks to restore function Thoracic surgery consultation requested  PCCM will not see unless asked tomorrow 08/06. Will see again Monday  Merton Border, MD PCCM service Mobile [phone] Pager [phone] 06/06/2016

## 2016-06-06 NOTE — Progress Notes (Signed)
Pt.'s daughter arrived and wanted to initiate transfer to [facility] for pt. I explained I would have to call MD. Dr. Ara Kussmaul was paged and she stated she would inform the day doctors about the transfer but since Henry Lewis is stable he will continue here with [place] tonight. This will be addressed tomorrow.

## 2016-06-06 NOTE — Progress Notes (Addendum)
Paged [facility] to request order for telemetry and home c-pap. Dr. Jannifer Franklin placed order for telemetry. No new orders for home c-pap at this time.

## 2016-06-07 ENCOUNTER — Ambulatory Visit (HOSPITAL_COMMUNITY)
Admission: AD | Admit: 2016-06-07 | Discharge: 2016-06-07 | Disposition: A | Discharge status: Home / Self Care | Payer: Medicare Other | Source: Other Acute Inpatient Hospital | Attending: Emergency Medicine | Admitting: Emergency Medicine

## 2016-06-07 DIAGNOSIS — I959 Hypotension, unspecified: Secondary | ICD-10-CM

## 2016-06-07 DIAGNOSIS — R0600 Dyspnea, unspecified: Secondary | ICD-10-CM

## 2016-06-07 DIAGNOSIS — R6510 Systemic inflammatory response syndrome (SIRS) of non-infectious origin without acute organ dysfunction: Secondary | ICD-10-CM

## 2016-06-07 DIAGNOSIS — N289 Disorder of kidney and ureter, unspecified: Secondary | ICD-10-CM

## 2016-06-07 DIAGNOSIS — R5381 Other malaise: Secondary | ICD-10-CM

## 2016-06-07 LAB — CBC
HCT: 32.2 % — ABNORMAL LOW (ref 40.0–52.0)
HEMOGLOBIN: 11 g/dL — AB (ref 13.0–18.0)
MCH: 30.3 pg (ref 26.0–34.0)
MCHC: 34.2 g/dL (ref 32.0–36.0)
MCV: 88.6 fL (ref 80.0–100.0)
PLATELETS: 384 10*3/uL (ref 150–440)
RBC: 3.63 MIL/uL — AB (ref 4.40–5.90)
RDW: 14.5 % (ref 11.5–14.5)
WBC: 19.5 10*3/uL — AB (ref 3.8–10.6)

## 2016-06-07 LAB — VANCOMYCIN, TROUGH: VANCOMYCIN TR: 12 ug/mL — AB (ref 15–20)

## 2016-06-07 MED ORDER — MAGIC MOUTHWASH
10.0000 mL | Freq: Four times a day (QID) | ORAL | Status: DC
  Administered 2016-06-07 (×2): 10 mL via ORAL
  Filled 2016-06-07 (×6): qty 10

## 2016-06-07 MED ORDER — VANCOMYCIN HCL 10 G IV SOLR
1250.0000 mg | Freq: Two times a day (BID) | INTRAVENOUS | Status: DC
  Administered 2016-06-07: 1250 mg via INTRAVENOUS
  Filled 2016-06-07 (×2): qty 1250

## 2016-06-07 NOTE — Progress Notes (Signed)
Pt to be transferred to [facility] [id number]. Report called to rachel calva at [phone]. [facility] notified and will transport.

## 2016-06-07 NOTE — Progress Notes (Signed)
Pharmacy Antibiotic Note  Henry Lewis is a 75 y.o. male admitted on 06/04/2016 with pneumonia/Empyema.  Pharmacy has been consulted for vancomycin and Zosyn dosing.  Currently ordered Vancomycin 1500mg  IV Q18H. 8/6 DW: 86.5 kg, T1/2: 10.5hr,  Ke: 0.066, Vd: 60.5L.  Plan: 8/6 ~ 06:30: Vanc Trough = 12.   Will adjust dosing to Vancomycin 1250mg  IV Q12H.  Ordered trough level on 8/8 at 07:30.  Will continue to monitor renal function and adjust dose as needed.  Will continue Zosyn 3.375 IV EI every 8 hours.   Height: 6' (182.9 cm) Weight: 220 lb (99.8 kg) IBW/kg (Calculated) : 77.6  Temp (24hrs), Avg:99.1 F (37.3 C), Min:98.3 F (36.8 C), Max:100 F (37.8 C)   Recent Labs Lab 06/01/16 0410 06/04/16 1530 06/04/16 1708 06/04/16 2048 06/05/16 0034 06/06/16 0608 06/07/16 0627  WBC 16.8* 19.8*  --   --  15.7* 17.2* 19.5*  CREATININE  --  1.41*  --   --  1.19 1.08  --   LATICACIDVEN  --   --  2.3* 2.5* 1.9  --   --   VANCOTROUGH  --   --   --   --   --   --  12*    Estimated Creatinine Clearance: 74.5 mL/min (by C-G formula based on SCr of 1.08 mg/dL).    Allergies  Allergen Reactions  . Codeine Sulfate   . Morphine And Related Other (See Comments)    Higher doses - hallucinates  . Latex Palpitations    Skin irritation with latex bandaids    Antimicrobials this admission: 8/3 Zosyn >>  8/3 Vancomycin >>   Dose adjustments this admission: 8/6  Vancomycin 1500mg  IV Q18H >> 1250mg  IV Q12H.  Microbiology results: 8/3 BCx: NG 8/3 UCx: NG 8/3 MRSA PCR: negative 8/4 Body Fluid Cx: pending  Thank you for allowing pharmacy to be a part of this patient's care.  Olivia Canter, [facility] Clinical Pharmacist 06/07/2016, 7:32 AM

## 2016-06-07 NOTE — Progress Notes (Signed)
Pt had quiet day. No c/o pain. Very d.o.e.Marland Kitchen Chest tube draining scantily. Pt remains a/o. Temp 99.7. Wife at bedside. Pt awaiting transfer to [facility] when bed available.

## 2016-06-07 NOTE — Discharge Summary (Signed)
[facility] [place] NAME: Henry Lewis    MR#:  [id number]  DATE OF BIRTH:  [birth year]  DATE OF ADMISSION:  06/04/2016 ADMITTING PHYSICIAN: Hillary Bow, MD  DATE OF DISCHARGE: No discharge date for patient encounter.  PRIMARY CARE PHYSICIAN: Madelyn Brunner, MD     ADMISSION DIAGNOSIS:  Sepsis, due to unspecified organism ([place]) [A41.9]  DISCHARGE DIAGNOSIS:  Principal Problem:   Exudative pleural effusion Active Problems:   SIRS (systemic inflammatory response syndrome) (HCC)   Dyspnea   Malnutrition of moderate degree   Acute renal insufficiency   Hypotension   Malaise   SECONDARY DIAGNOSIS:   Past Medical History:  Diagnosis Date  . Chronic kidney disease   . Hypertension   . renal ca ( LT ) 12/2005   lt rfa  . rt renal ca dx'd 2006   rt nephrectomy  . Sleep apnea     .pro HOSPITAL COURSE:  Patient is a 75 year old Caucasian male with past history significant for history of essential hypertension, renal cell carcinoma, status post nephrectomy, OSA, CKD, diastolic CHF, who presents to the hospital with complaints of weakness, shortness of breath, acute on chronic renal failure. Apparently,  he was in the hospital twice for right  thoracentesis of 2 and 1.5 L of pleural fluid removal, fluid cultures have been negative. Cytology was also negative, according to the pathologist looked at the smear, although flow cytometry is still pending. The patient has been treated with levofloxacin for suspected pneumonia. On arrival to the hospital 06/04/2016, however, the patient was noted to have leukocytosis to above 14,000, elevated transaminases to 70s and 80s. His chest x-ray revealed increasing right pleural fluid, partially loculated along the right mid lateral and he might thorax, increase in opacity of right lung base, likely atelectasis. Patient was admitted to the hospital for further evaluation and treatment. He  underwent CT-guided placement of percutaneous catheter into the right chest , initially aspirating 800 cc of blood tinged pleural fluid. Unfortunately, the fluid was very thick and pleural catheter was not draining well despite attempted TPA administration. Chest tube placement was recommended by pulmonary, but thoracic surgeon is not available until Monday. Patient denies any significant shortness of breath ,  he remains afebrile with max T of 100.0 F over the past 24 hours, mildly hypoxic, on 2 L of oxygen through nasal cannula, CPAP at nighttime. Admits of some discomfort in the right chest at pigtail catheter placement site. Patient's family decided against therapy at [facility], requested transfer to [facility], I discussed patient's case with medicine service, he was referred to thoracic surgery service, I discussed with Dr. Laverta Baltimore, who graciously agreed to accept this patient for transfer. Discussion by problem: #1 Sirs, present on admission - Likely due to underlying RIGHT PLEURAL EFFUSION, EXUDATIVE, LYMPHOCYTE PREDOMINANT- continue IV Zosyn. Blood cultures are negative 2 days, pleural fluid culture is NEGATIVE. Gram stain was canceled due to pleural fluid being very thick, could not perform. The MRSA PCR was negative.   * Recurrent right-sided pleural effusion, EXUDATIVE, LYMPHOCYTE PREDOMINANT - INITIALLY IT WAS WORRISOME for empyema, HOWEVER, fluid  was consistent with exudative lymphocytic predominance, cultures were negative, continue patient on Zosyn for now S/p right pigtail Pleural catheter placement by interventional radiology on 06/05/2016,  patient's family decided to transferpatient to tertiary care center due to absence of thoracic surgeon in the hospital over the weekend. - Appreciate pulmonary evaluation - recommends checking  quantiferon gold to rule out tuberculous pleuritis, pending. I discussed patient's case with Dr. Laverta Baltimore, who agreed to transfer to  thoracic surgery service at [facility].  * Acute renal insufficiency - Likely prerenal - Improved with IV hydration  * Hypotension Holding lisinopril and hydrochlorothiazide  *acute respiratory failure with hypoxia due to recurrent right-sided pleural effusion. Will continue oxygen therapy as needed, keeping pulse oximetry at 94% and above  *Leukocytosis,Seemed to be worsening despite antibiotic therapy, could be related to fluid collection alone, not necessarily septic process, watch closely  *an episode of bradycardia, resolved, no further interventions recommended by cardiologist  *elevated transaminase level, possibly related to pleural effusion,CT of chest on 05/21/2016 showed no abnormalities in the upper abdomen, including the liver, follow LFTs in the morning DISCHARGE CONDITIONS:   stable  CONSULTS OBTAINED:  Treatment Team:  Corey Skains, MD Laverle Hobby, MD Vilinda Boehringer, MD Clayburn Pert, MD  DRUG ALLERGIES:   Allergies  Allergen Reactions  . Codeine Sulfate   . Morphine And Related Other (See Comments)    Higher doses - hallucinates  . Latex Palpitations    Skin irritation with latex bandaids    DISCHARGE MEDICATIONS:   Current Discharge Medication List    CONTINUE these medications which have NOT CHANGED   Details  allopurinol (ZYLOPRIM) 300 MG tablet Take 300 mg by mouth daily.    aspirin 81 MG EC tablet Take 81 mg by mouth daily.    Boswellia-Glucosamine-Vit D (GLUCOSAMINE COMPLEX PO) Take by mouth.    Cholecalciferol (VITAMIN D-3) 1000 UNITS CAPS Take by mouth.    escitalopram (LEXAPRO) 20 MG tablet Take 20 mg by mouth daily.    Multiple Vitamins-Minerals (MULTIVITAMIN ADULT PO) Take by mouth.      STOP taking these medications     levofloxacin (LEVAQUIN) 500 MG tablet      lisinopril-hydrochlorothiazide (PRINZIDE,ZESTORETIC) 20-25 MG tablet          DISCHARGE INSTRUCTIONS:    The patient is to follow-up with  primary care physician upon discharge  If you experience worsening of your admission symptoms, develop shortness of breath, life threatening emergency, suicidal or homicidal thoughts you must seek medical attention immediately by calling 911 or calling your MD immediately  if symptoms less severe.  You Must read complete instructions/literature along with all the possible adverse reactions/side effects for all the Medicines you take and that have been prescribed to you. Take any new Medicines after you have completely understood and accept all the possible adverse reactions/side effects.   Please note  You were cared for by a hospitalist during your hospital stay. If you have any questions about your discharge medications or the care you received while you were in the hospital after you are discharged, you can call the unit and asked to speak with the hospitalist on call if the hospitalist that took care of you is not available. Once you are discharged, your primary care physician will handle any further medical issues. Please note that NO REFILLS for any discharge medications will be authorized once you are discharged, as it is imperative that you return to your primary care physician (or establish a relationship with a primary care physician if you do not have one) for your aftercare needs so that they can reassess your need for medications and monitor your lab values.    Today   CHIEF COMPLAINT:   Chief Complaint  Patient presents with  . abnormal labs  . Anorexia  HISTORY OF PRESENT ILLNESS:  Henry Lewis  is a 75 y.o. male with a known history of essential hypertension, renal cell carcinoma, status post nephrectomy, OSA, CKD, diastolic CHF, who presents to the hospital with complaints of weakness, shortness of breath, acute on chronic renal failure. Apparently,  he was in the hospital twice for right  thoracentesis of 2 and 1.5 L of pleural fluid removal, fluid cultures have been  negative. Cytology was also negative, according to the pathologist looked at the smear, although flow cytometry is still pending. The patient has been treated with levofloxacin for suspected pneumonia. On arrival to the hospital 06/04/2016, however, the patient was noted to have leukocytosis to above 14,000, elevated transaminases to 70s and 80s. His chest x-ray revealed increasing right pleural fluid, partially loculated along the right mid lateral and he might thorax, increase in opacity of right lung base, likely atelectasis. Patient was admitted to the hospital for further evaluation and treatment. He underwent CT-guided placement of percutaneous catheter into the right chest , initially aspirating 800 cc of blood tinged pleural fluid. Unfortunately, the fluid was very thick and pleural catheter was not draining well despite attempted TPA administration. Chest tube placement was recommended by pulmonary, but thoracic surgeon is not available until Monday. Patient denies any significant shortness of breath ,  he remains afebrile with max T of 100.0 F over the past 24 hours, mildly hypoxic, on 2 L of oxygen through nasal cannula, CPAP at nighttime. Admits of some discomfort in the right chest at pigtail catheter placement site. Patient's family decided against therapy at [facility], requested transfer to [facility], I discussed patient's case with medicine service, he was referred to thoracic surgery service, I discussed with Dr. Laverta Baltimore, who graciously agreed to accept this patient for transfer. Discussion by problem: #1 Sirs, present on admission - Likely due to underlying RIGHT PLEURAL EFFUSION, EXUDATIVE, LYMPHOCYTE PREDOMINANT- continue IV Zosyn. Blood cultures are negative 2 days, pleural fluid culture is NEGATIVE. Gram stain was canceled due to pleural fluid being very thick, could not perform. The MRSA PCR was negative.   * Recurrent right-sided pleural effusion, EXUDATIVE,  LYMPHOCYTE PREDOMINANT - INITIALLY IT WAS WORRISOME for empyema, HOWEVER, fluid  was consistent with exudative lymphocytic predominance, cultures were negative, continue patient on Zosyn for now S/p right pigtail Pleural catheter placement by interventional radiology on 06/05/2016,  patient's family decided to transferpatient to tertiary care center due to absence of thoracic surgeon in the hospital over the weekend. - Appreciate pulmonary evaluation - recommends checking quantiferon gold to rule out tuberculous pleuritis, pending. I discussed patient's case with Dr. Laverta Baltimore, who agreed to transfer to thoracic surgery service at [facility].  * Acute renal insufficiency - Likely prerenal - Improved with IV hydration  * Hypotension Holding lisinopril and hydrochlorothiazide  *acute respiratory failure with hypoxia due to recurrent right-sided pleural effusion. Will continue oxygen therapy as needed, keeping pulse oximetry at 94% and above  *Leukocytosis,Seemed to be worsening despite antibiotic therapy, could be related to fluid collection alone, not necessarily septic process, watch closely  *an episode of bradycardia, resolved, no further interventions recommended by cardiologist  *elevated transaminase level, possibly related to pleural effusion,CT of chest on 05/21/2016 showed no abnormalities in the upper abdomen, including the liver, follow LFTs in the morning   VITAL SIGNS:  Blood pressure 133/63, pulse (!) 107, temperature 98.9 F (37.2 C), temperature source Oral, resp. rate 20, height 6' (1.829 m), weight 99.8 kg (220 lb),  SpO2 92 %.  I/O:   Intake/Output Summary (Last 24 hours) at 06/07/16 0954 Last data filed at 06/07/16 0900  Gross per 24 hour  Intake             1030 ml  Output             1240 ml  Net             -210 ml    PHYSICAL EXAMINATION:  GENERAL:  75 y.o.-year-old patient lying in the bed with no acute distress.  EYES: Pupils equal, round, reactive to  light and accommodation. No scleral icterus. Extraocular muscles intact.  HEENT: Head atraumatic, normocephalic. Oropharynx and nasopharynx clear.  NECK:  Supple, no jugular venous distention. No thyroid enlargement, no tenderness.  LUNGS: Normal breath sounds on the left, right chest revealed crackles posteriorly, half way lung fields, no wheezing, rales,rhonchi , but crepitations as above, dullness to percussion. Intermittent use of accessory muscles of respiration, with exertion and speech.  CARDIOVASCULAR: S1, S2 normal. No murmurs, rubs, or gallops.  ABDOMEN: Soft, non-tender, non-distended. Bowel sounds present. No organomegaly or mass.  EXTREMITIES: No pedal edema, cyanosis, or clubbing.  NEUROLOGIC: Cranial nerves II through XII are intact. Muscle strength 5/5 in all extremities. Sensation intact. Gait not checked.  PSYCHIATRIC: The patient is alert and oriented x 3.  SKIN: No obvious rash, lesion, or ulcer.   DATA REVIEW:   CBC  Recent Labs Lab 06/07/16 0627  WBC 19.5*  HGB 11.0*  HCT 32.2*  PLT 384    Chemistries   Recent Labs Lab 06/04/16 1530  06/06/16 0608  NA 135  < > 140  K 5.1  < > 4.6  CL 102  < > 109  CO2 19*  < > 24  GLUCOSE 109*  < > 120*  BUN 47*  < > 26*  CREATININE 1.41*  < > 1.08  CALCIUM 9.0  < > 8.4*  AST 81*  --   --   ALT 71*  --   --   ALKPHOS 164*  --   --   BILITOT 1.3*  --   --   < > = values in this interval not displayed.  Cardiac Enzymes No results for input(s): TROPONINI in the last 168 hours.  Microbiology Results  Results for orders placed or performed during the hospital encounter of 06/04/16  Urine culture     Status: None   Collection Time: 06/04/16  6:14 PM  Result Value Ref Range Status   Specimen Description URINE, RANDOM  Final   Special Requests NONE  Final   Culture NO GROWTH Performed at [facility]   Final   Report Status 06/06/2016 FINAL  Final  Blood Culture (routine x 2)     Status: None (Preliminary  result)   Collection Time: 06/04/16  6:33 PM  Result Value Ref Range Status   Specimen Description BLOOD RIGHT HAND  Final   Special Requests BOTTLES DRAWN AEROBIC AND ANAEROBIC 5CC  Final   Culture NO GROWTH 2 DAYS  Final   Report Status PENDING  Incomplete  Blood Culture (routine x 2)     Status: None (Preliminary result)   Collection Time: 06/04/16  6:33 PM  Result Value Ref Range Status   Specimen Description BLOOD LEFT HAND  Final   Special Requests   Final    BOTTLES DRAWN AEROBIC AND ANAEROBIC [place] AERO 8CC ANA   Culture NO GROWTH 2 DAYS  Final  Report Status PENDING  Incomplete  MRSA PCR Screening     Status: None   Collection Time: 06/05/16 12:28 AM  Result Value Ref Range Status   MRSA by PCR NEGATIVE NEGATIVE Final    Comment:        The GeneXpert MRSA Assay (FDA approved for NASAL specimens only), is one component of a comprehensive MRSA colonization surveillance program. It is not intended to diagnose MRSA infection nor to guide or monitor treatment for MRSA infections.   Body fluid culture     Status: None (Preliminary result)   Collection Time: 06/05/16  4:35 PM  Result Value Ref Range Status   Specimen Description Pleural R  Final   Special Requests NONE  Final   Gram Stain PENDING  Incomplete   Culture   Final    NO GROWTH 1 DAY Performed at [facility]    Report Status PENDING  Incomplete    RADIOLOGY:  Dg Chest Port 1 View  Result Date: 06/06/2016 CLINICAL DATA:  Respiratory failure, pleural effusion, f/o right chest tube EXAM: PORTABLE CHEST 1 VIEW COMPARISON:  06/05/2016 FINDINGS: Normal cardiac silhouette. Large RIGHT effusion unchanged. RIGHT chest tube in place. LEFT lung is clear. IMPRESSION: No interval change. Large RIGHT effusion with chest tube in place. Electronically Signed   By: Suzy Bouchard M.D.   On: 06/06/2016 08:12   Dg Chest Port 1 View  Result Date: 06/05/2016 CLINICAL DATA:  Patient with right chest tube which is now  not draining. EXAM: PORTABLE CHEST 1 VIEW COMPARISON:  Chest radiograph Mar 04, 2016 FINDINGS: Monitoring leads overlie the patient. Stable cardiac and mediastinal contours. Low lung volumes. Interval increase in moderate to large right pleural effusion with underlying pulmonary consolidation. Minimal heterogeneous opacities left lung base. There is a right-sided chest tube along the lateral right hemi thorax. No pneumothorax. IMPRESSION: Right-sided chest tube along the lateral right lower hemi thorax. Interval increase in size of now moderate to large right pleural effusion with underlying pulmonary consolidation. Probable left basilar atelectasis. Electronically Signed   By: Lovey Newcomer M.D.   On: 06/05/2016 19:18   Ct Image Guided Drainage By Percutaneous Catheter  Result Date: 06/05/2016 INDICATION: Recurrent symptomatic right-sided pleural effusion post 2 previous ultrasound-guided thoracentesis. Request made now for placement of a CT-guided right chest tube. EXAM: CT IMAGE GUIDED DRAINAGE BY PERCUTANEOUS CATHETER COMPARISON:  None. MEDICATIONS: The patient is currently admitted to the hospital and receiving intravenous antibiotics. The antibiotics were administered within an appropriate time frame prior to the initiation of the procedure. ANESTHESIA/SEDATION: Moderate (conscious) sedation was employed during this procedure. A total of Fentanyl 100 mcg was administered intravenously. Moderate Sedation Time: 15 minutes. The patient's level of consciousness and vital signs were monitored continuously by radiology nursing throughout the procedure under my direct supervision. CONTRAST:  None COMPLICATIONS: None immediate. PROCEDURE: Informed written consent was obtained from the patient after a discussion of the risks, benefits and alternatives to treatment. The patient was placed supine on the CT gantry and a pre procedural CT was performed re-demonstrating the recurrent moderate to large size right-sided  pleural effusion. The procedure was planned. A timeout was performed prior to the initiation of the procedure. The skin overlying the right inferior intra lateral chest was prepped and draped in the usual sterile fashion. The overlying soft tissues were anesthetized with 1% lidocaine with epinephrine. Appropriate trajectory was planned with the use of a 22 gauge spinal needle. An 18 gauge trocar needle was advanced  into the right pleural space and a short Amplatz super stiff wire was coiled within the collection. Appropriate positioning was confirmed with a limited CT scan. The tract was serially dilated allowing placement of a 12 [place] all-purpose drainage catheter. Appropriate positioning was confirmed with a limited postprocedural CT scan. Following chest tube placement, approximately 800 cc of slightly blood tinged pleural fluid was aspirated. The tube was connected to a pleural vac and sutured in place. A dressing was placed. The patient tolerated the procedure well without immediate post procedural complication. IMPRESSION: Successful CT guided placement of a 46 French all purpose drain catheter into the right pleural space with aspiration of 800 cc of slightly blood tinged pleural fluid. Samples were sent to the laboratory as requested by the ordering clinical team. Electronically Signed   By: Sandi Mariscal M.D.   On: 06/05/2016 17:45    EKG:   Orders placed or performed during the hospital encounter of 06/04/16  . EKG 12-Lead  . EKG 12-Lead      Management plans discussed with the patient, family and they are in agreement.  CODE STATUS:     Code Status Orders        Start     Ordered   06/04/16 1826  Full code  Continuous     06/04/16 1826    Code Status History    Date Active Date Inactive Code Status Order ID Comments User Context   05/29/2016  6:48 PM 05/30/2016  1:38 PM Full Code [id number]  Henreitta Leber, MD Inpatient      TOTAL TIME TAKING CARE OF THIS PATIENT: 40 minutes.     Theodoro Grist M.D on 06/07/2016 at 9:54 AM  Between 7am to 6pm - Pager - [phone]  After 6pm go to [email or web address] - password [email or web address] [place] [facility]  Office  [phone]  CC: Primary care physician; Madelyn Brunner, MD

## 2016-06-07 NOTE — Progress Notes (Signed)
Pt. Slept off and on during the night he did apply C-Pap. Chest tube continues to drain small amount of serous fluid with blood clots visible in tubing. IV ABT continues. No signs or c/o SOB, pain or acute distress noted. Respiration running in 20' low 30's throughout the night. Will continue to monitor patient.

## 2016-06-07 NOTE — Care Management Note (Signed)
Case Management Note  Patient Details  Name: Henry Lewis MRN: [id number] Date of Birth: 12/27/1940  Subjective/Objective:     Per Dr Keenan Bachelor note Henry Lewis is currently pending a transfer to [facility] and is awaiting a bed.                Action/Plan:   Expected Discharge Date:                  Expected Discharge Plan:     In-House Referral:     Discharge planning Services     Post Acute Care Choice:    Choice offered to:     DME Arranged:    DME Agency:     HH Arranged:    HH Agency:     Status of Service:     If discussed at [employer] of Stay Meetings, dates discussed:    Additional Comments:  Tameem Pullara A, RN 06/07/2016, 11:08 AM

## 2016-06-08 LAB — QUANTIFERON IN TUBE
QFT TB AG MINUS NIL VALUE: <0 IU/mL
QUANTIFERON MITOGEN VALUE: 6.42 IU/mL
QUANTIFERON TB AG VALUE: 0.06 IU/mL
QUANTIFERON TB GOLD: NEGATIVE
Quantiferon Nil Value: 0.23 IU/mL

## 2016-06-08 LAB — QUANTIFERON TB GOLD ASSAY (BLOOD)

## 2016-06-08 NOTE — Progress Notes (Signed)
Pt transported to [facility] via [facility]. Vitals signs stable. Pt reports mild pain but refusing any pain medication. Belongings sent with wife and son who were present at bedside at time of transportation. Report and paper documents given to [facility] personnel. [facility] updated with ETA.   Henry Lewis

## 2016-06-09 ENCOUNTER — Encounter: Payer: Self-pay | Admitting: *Deleted

## 2016-06-09 ENCOUNTER — Inpatient Hospital Stay: Payer: Medicare Other | Admitting: Internal Medicine

## 2016-06-09 LAB — COMP PANEL: LEUKEMIA/LYMPHOMA

## 2016-06-09 LAB — CYTOLOGY - NON PAP

## 2016-06-09 LAB — BODY FLUID CULTURE: Culture: NO GROWTH

## 2016-06-10 LAB — PH, BODY FLUID: pH, Body Fluid: 7.6

## 2016-06-11 LAB — CULTURE, BLOOD (ROUTINE X 2): CULTURE: NO GROWTH

## 2016-07-03 LAB — CULTURE, BLOOD (ROUTINE X 2): CULTURE: NO GROWTH

## 2016-07-03 DEATH — deceased
